# Patient Record
Sex: Female | Born: 1990 | Race: Black or African American | Hispanic: No | Marital: Single | Smoking: Never smoker
Health system: Southern US, Community
[De-identification: ages and names within clinical notes are randomized; demographics above are authoritative.]

## PROBLEM LIST (undated history)

## (undated) DIAGNOSIS — A749 Chlamydial infection, unspecified: Secondary | ICD-10-CM

## (undated) DIAGNOSIS — D649 Anemia, unspecified: Secondary | ICD-10-CM

## (undated) HISTORY — PX: NO PAST SURGERIES: SHX2092

---
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
## 2009-11-21 ENCOUNTER — Inpatient Hospital Stay (HOSPITAL_COMMUNITY): Admission: AD | Admit: 2009-11-21 | Discharge: 2009-11-21 | Payer: Self-pay | Admitting: Obstetrics & Gynecology

## 2010-05-20 ENCOUNTER — Emergency Department (HOSPITAL_COMMUNITY): Admission: EM | Admit: 2010-05-20 | Discharge: 2010-05-21 | Payer: Self-pay | Admitting: Emergency Medicine

## 2010-09-30 ENCOUNTER — Inpatient Hospital Stay (HOSPITAL_COMMUNITY)
Admission: AD | Admit: 2010-09-30 | Discharge: 2010-09-30 | Disposition: A | Discharge status: Home / Self Care | Payer: BC Managed Care – PPO | Source: Ambulatory Visit | Attending: Obstetrics & Gynecology | Admitting: Obstetrics & Gynecology

## 2010-09-30 DIAGNOSIS — J309 Allergic rhinitis, unspecified: Secondary | ICD-10-CM | POA: Insufficient documentation

## 2010-09-30 DIAGNOSIS — R05 Cough: Secondary | ICD-10-CM

## 2010-09-30 DIAGNOSIS — O9989 Other specified diseases and conditions complicating pregnancy, childbirth and the puerperium: Secondary | ICD-10-CM

## 2010-09-30 DIAGNOSIS — R059 Cough, unspecified: Secondary | ICD-10-CM | POA: Insufficient documentation

## 2010-09-30 DIAGNOSIS — O99891 Other specified diseases and conditions complicating pregnancy: Secondary | ICD-10-CM | POA: Insufficient documentation

## 2010-10-15 ENCOUNTER — Inpatient Hospital Stay (HOSPITAL_COMMUNITY)
Admission: AD | Admit: 2010-10-15 | Discharge: 2010-10-16 | Disposition: A | Discharge status: Home / Self Care | Payer: BC Managed Care – PPO | Source: Ambulatory Visit | Attending: Obstetrics & Gynecology | Admitting: Obstetrics & Gynecology

## 2010-10-15 DIAGNOSIS — O210 Mild hyperemesis gravidarum: Secondary | ICD-10-CM | POA: Insufficient documentation

## 2010-10-15 LAB — URINE MICROSCOPIC-ADD ON

## 2010-10-15 LAB — URINALYSIS, ROUTINE W REFLEX MICROSCOPIC
Hgb urine dipstick: NEGATIVE
Ketones, ur: 40 mg/dL — AB
Protein, ur: 30 mg/dL — AB
Urine Glucose, Fasting: NEGATIVE mg/dL
pH: 7 (ref 5.0–8.0)

## 2010-11-18 LAB — URINALYSIS, ROUTINE W REFLEX MICROSCOPIC
Bilirubin Urine: NEGATIVE
Ketones, ur: NEGATIVE mg/dL
Nitrite: NEGATIVE
Specific Gravity, Urine: 1.025 (ref 1.005–1.030)
Urobilinogen, UA: 0.2 mg/dL (ref 0.0–1.0)
pH: 6 (ref 5.0–8.0)

## 2010-11-18 LAB — URINE CULTURE

## 2010-11-18 LAB — URINE MICROSCOPIC-ADD ON

## 2013-08-25 NOTE — L&D Delivery Note (Signed)
Delivery Information for ZONIA CAPLIN     admitted at: 11/14/2013 10:50 PM, active labor and SROM    Patient Active Problem List   Diagnosis   . Pregnancy   . [redacted] weeks gestation of pregnancy       Time of Delivery: 12:31 AM  11/15/2013     Obstetrician:   Raoul Pitch, MD    Procedure: Spontaneous vaginal delivery                        A female infant was delivered from the [facility] presentation                       SROM of forebag with light meconium immediately prior to pushing                        Peds present for delivery; baby spontaneously cried                       Pushing began at 1225                       Anesthesia:  None     Infant Wt:      8 lb 5.7 oz (3790 g)    Infant Name:  Suede      Apgars:  8  at and 8  at 5 minutes    ROM Time:    ? 10 am 11-14-2013  Amniotic Fluid:   clear initially, light meconium with rupture of forebag      Placenta and Cord:          Mechanism: Spontaneous       Description: Intact                              Placenta delivered spontaneously and intact with a 3 vessel cord                           Uterus explored - empty    Episiotomy: None     Lacerations:   yes   Laceration Type: first degree right labial and 1st degree perineal   Episiotomy/Laceration Repair: No repair required     Estimated Blood Loss:   100 cc's        Specimens: none           Complications:  none

## 2013-11-14 ENCOUNTER — Inpatient Hospital Stay
Admission: EM | Admit: 2013-11-14 | Discharge: 2013-11-16 | DRG: 775 | Disposition: A | Discharge status: Home / Self Care | Payer: BC Managed Care – HMO | Source: Ambulatory Visit | Attending: Obstetrics & Gynecology | Admitting: Obstetrics & Gynecology

## 2013-11-14 ENCOUNTER — Ambulatory Visit
Admission: RE | Admit: 2013-11-14 | Discharge: 2013-11-14 | Discharge status: Against Medical Advice | Payer: BC Managed Care – HMO | Attending: Obstetrics & Gynecology | Admitting: Obstetrics & Gynecology

## 2013-11-14 ENCOUNTER — Inpatient Hospital Stay: Payer: BC Managed Care – HMO | Admitting: Obstetrics & Gynecology

## 2013-11-14 DIAGNOSIS — Z331 Pregnant state, incidental: Secondary | ICD-10-CM

## 2013-11-14 DIAGNOSIS — N898 Other specified noninflammatory disorders of vagina: Secondary | ICD-10-CM | POA: Insufficient documentation

## 2013-11-14 DIAGNOSIS — Z22330 Carrier of Group B streptococcus: Secondary | ICD-10-CM

## 2013-11-14 DIAGNOSIS — Z3A40 40 weeks gestation of pregnancy: Secondary | ICD-10-CM

## 2013-11-14 DIAGNOSIS — Z3490 Encounter for supervision of normal pregnancy, unspecified, unspecified trimester: Secondary | ICD-10-CM

## 2013-11-14 DIAGNOSIS — O99891 Other specified diseases and conditions complicating pregnancy: Secondary | ICD-10-CM | POA: Insufficient documentation

## 2013-11-14 DIAGNOSIS — O99892 Other specified diseases and conditions complicating childbirth: Principal | ICD-10-CM | POA: Diagnosis present

## 2013-11-14 HISTORY — DX: Chlamydial infection, unspecified: A74.9

## 2013-11-14 LAB — CBC AND DIFFERENTIAL
Basophils Absolute Automated: 0.03 10*3/uL (ref 0.00–0.20)
Basophils Automated: 0 %
Eosinophils Absolute Automated: 0.14 10*3/uL (ref 0.00–0.70)
Eosinophils Automated: 1 %
Hematocrit: 36.8 % — ABNORMAL LOW (ref 37.0–47.0)
Hgb: 12.1 g/dL (ref 12.0–16.0)
Immature Granulocytes Absolute: 0.03 10*3/uL
Immature Granulocytes: 0 %
Lymphocytes Absolute Automated: 1.53 10*3/uL (ref 0.50–4.40)
Lymphocytes Automated: 14 %
MCH: 26.9 pg — ABNORMAL LOW (ref 28.0–32.0)
MCHC: 32.9 g/dL (ref 32.0–36.0)
MCV: 82 fL (ref 80.0–100.0)
MPV: 12.5 fL — ABNORMAL HIGH (ref 9.4–12.3)
Monocytes Absolute Automated: 0.69 10*3/uL (ref 0.00–1.20)
Monocytes: 6 %
Neutrophils Absolute: 8.48 10*3/uL — ABNORMAL HIGH (ref 1.80–8.10)
Neutrophils: 78 %
Platelets: 227 10*3/uL (ref 140–400)
RBC: 4.49 10*6/uL (ref 4.20–5.40)
RDW: 14 % (ref 12–15)
WBC: 10.87 10*3/uL — ABNORMAL HIGH (ref 3.50–10.80)

## 2013-11-14 LAB — TYPE AND SCREEN
AB Screen Gel: NEGATIVE
ABO Rh: O POS

## 2013-11-14 LAB — RUPTURE OF MEMBRANE AMNISURE: Rupture of Membrane AmniSure: POSITIVE — AB

## 2013-11-14 MED ORDER — SODIUM CHLORIDE 0.9 % IV SOLN
2.5000 10*6.[IU] | INTRAVENOUS | Status: DC
Start: 2013-11-15 — End: 2013-11-15
  Filled 2013-11-14 (×5): qty 2.5

## 2013-11-14 MED ORDER — LACTATED RINGERS IV SOLN
INTRAVENOUS | Status: DC
Start: 2013-11-14 — End: 2013-11-15

## 2013-11-14 MED ORDER — FAMOTIDINE 10 MG/ML IV SOLN (WRAP)
20.0000 mg | Freq: Two times a day (BID) | INTRAVENOUS | Status: DC | PRN
Start: 2013-11-14 — End: 2013-11-15

## 2013-11-14 MED ORDER — ACETAMINOPHEN 325 MG PO TABS
650.0000 mg | ORAL_TABLET | ORAL | Status: DC | PRN
Start: 2013-11-14 — End: 2013-11-14

## 2013-11-14 MED ORDER — PENICILLIN 2.5 MU IN NORMAL SALINE 100 ML (CNR)
2.5000 10*6.[IU] | Status: DC
Start: 2013-11-15 — End: 2013-11-14

## 2013-11-14 MED ORDER — LACTATED RINGERS IV SOLN
INTRAVENOUS | Status: DC
Start: 2013-11-14 — End: 2013-11-14
  Administered 2013-11-14: 125 mL/h via INTRAVENOUS

## 2013-11-14 MED ORDER — TERBUTALINE SULFATE 1 MG/ML IJ SOLN
0.2500 mg | Freq: Once | INTRAMUSCULAR | Status: DC | PRN
Start: 2013-11-14 — End: 2013-11-15

## 2013-11-14 MED ORDER — ONDANSETRON HCL 4 MG/2ML IJ SOLN
8.0000 mg | Freq: Three times a day (TID) | INTRAMUSCULAR | Status: DC | PRN
Start: 2013-11-14 — End: 2013-11-15

## 2013-11-14 MED ORDER — PENICILLIN 5 MU IN NORMAL SALINE 100 ML (CNR)
5.0000 10*6.[IU] | Freq: Once | Status: AC
Start: 2013-11-14 — End: 2013-11-14
  Administered 2013-11-14: 5 10*6.[IU] via INTRAVENOUS
  Filled 2013-11-14: qty 100

## 2013-11-14 MED ORDER — OXYTOCIN 30 UNITS IN LR 500 ML LABOR -OUTSOURCED
2.0000 m[IU]/min | INTRAVENOUS | Status: DC
Start: 2013-11-14 — End: 2013-11-15
  Filled 2013-11-14: qty 500

## 2013-11-14 MED ORDER — PENICILLIN G POTASSIUM 5000000 UNITS IJ SOLR
2.5000 10*6.[IU] | INTRAMUSCULAR | Status: DC
Start: 2013-11-15 — End: 2013-11-14

## 2013-11-14 MED ORDER — ONDANSETRON HCL 4 MG/2ML IJ SOLN
8.0000 mg | Freq: Three times a day (TID) | INTRAMUSCULAR | Status: DC | PRN
Start: 2013-11-14 — End: 2013-11-14

## 2013-11-14 MED ORDER — CITRIC ACID-SODIUM CITRATE 334-500 MG/5ML PO SOLN
30.0000 mL | Freq: Once | ORAL | Status: DC | PRN
Start: 2013-11-14 — End: 2013-11-14

## 2013-11-14 MED ORDER — ACETAMINOPHEN 650 MG RE SUPP
650.0000 mg | RECTAL | Status: DC | PRN
Start: 2013-11-14 — End: 2013-11-15

## 2013-11-14 MED ORDER — SODIUM CHLORIDE 0.9 % IV MBP
5.0000 10*6.[IU] | Freq: Once | INTRAVENOUS | Status: DC
Start: 2013-11-14 — End: 2013-11-14

## 2013-11-14 NOTE — Progress Notes (Signed)
Pt here in triage for evaluation of c/o mucous plug and watery discharge since 0200 this am. Pt states she is a pt of the midwife practice out of [place]. Pt states she is GBS + and not able to deliver with them d/t not being able to pay the delivery fee. Pt talked with Pattie G. Today. Pt placed on monitor.

## 2013-11-14 NOTE — Progress Notes (Signed)
Telephone call from [place].  Pattie informed earlier that pt does not want to be cared for by Dr. Malen Gauze, the hospitalist today. Pattie discussed situation with Dr. Malen Gauze, Dr. Tristan Schroeder and Dr. Manson Passey and is en route to hospital to discuss situation with pt.
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
## 2013-11-14 NOTE — Progress Notes (Signed)
I came in to speak to [facility] as I had spoken to her on the phone a few times today and encouraged her to come in to be evaluated for Ruptured membranes. Erikah was with her mother and her nurse, Florentina Addison.  She expressed dissatisfaction with Dr. Malen Gauze and wanted [place] to get her another doctor.She was upset that he did not introduce himself and performed a vaginal exam and when she complained he said" Let me do my job" and he repeated it " Just let me do my job".   I apologized.  I had previously contacted. Frances Maywood, PCD, Dr. Tristan Schroeder, Chair and Dr. Manson Passey, [facility] medical director. I explained to the patient that Dr. Malen Gauze was who was covering in emergencies, clinic patients and walk ins. I offered to have the RN perform vaginal exams if necessary and have Dr. Malen Gauze come in for the delivery. She did not agree to this. The mother was more vocal about her perceptions of the way her daughter was treated by Dr. Malen Gauze and that "no body deserves to be treated that way.  We drove past 3 hospitals to come here because we thought she would get the best care." I reinforced to her that she is getting good care.  I asked her where she would go and she said to "[facility]". The patient's mother said that they" were not refusing care but they were refusing to be treated like dogs". She said this multiple times after they were told about the AMA form and signing out against medical advice and that insurance may not cover the care here. The mother was insistent that they were leaving and the patient agreed.

## 2013-11-14 NOTE — Progress Notes (Signed)
Pt has chosen to leave AMA.  Pt signs AMA form.  IV d/c'd.  Monitors removed.  Encouraged pt to go straight to closest medical facility to receive care.  Pt verbalized understanding.

## 2013-11-14 NOTE — H&P (Signed)
Labor and Delivery Admission Note    Subjective:   Jaclyn Wiggins is a 23 y.o. G2P0001 female at [redacted]w[redacted]d, Estimated Date of Delivery: 11/14/13.  She is being admitted for labor management.  Her current obstetrical history is significant for GBS colonizer .  Patient reports she lost her mucous plug about 2 am, began leaking fluid about 10 am, and contractions several hours after that..  She was initially seen at [facility] but did not like the on call doctor, so after a positive amnisure and a dose of penicillin for her GBS she signed herself out AMA.  Fetal Movement: normal.    PMH - none  PSH - none  Meds - PNV  All - NKDA  POB - G2P1, SVD at term 7 1/2 lbs;  Seen by midwife group until 2 weeks ago when she couldn't afford to pay the delivery fee so left the practice  PGYN - positive chlamydia in January, treated with antibiotics, negative TOC  SH - no tobacco    Objective:    Vital signs in last 24 hours:  Temp:  [98.6 F (37 C)] 98.6 F (37 C)  Heart Rate:  [85-92] 85   Resp Rate:  [20] 20   BP: (112-126)/(69-75) 125/72 mmHg     FHTS: 140s, + LTV  Toco:  Contractions q 2-3 minutes    General:   alert, appears stated age and cooperative   Lungs:   clear to auscultation bilaterally   Heart:   regular rate and rhythm, S1, S2 normal, no murmur, click, rub or gallop   Abdomen:  soft, non-tender; bowel sounds normal; no masses,  no organomegaly   SVE:  8/90/0/BBOW     Lab Review:  Lab Results   Component Value Date    ABORH O POS 11/14/2013        Assessment:   23 y.o. G29P0001 female at [redacted]w[redacted]d   Active labor with positive amnisure    Problem List:  Patient Active Problem List   Diagnosis   . Pregnancy   . [redacted] weeks gestation of pregnancy       Plan:   Admit to L&D   Anticipate SVD   Will continue penicillin for GBS   Patient has no prenatal records so will redraw prenatal profile for pediatrics     Risks, benefits, alternatives and possible complications have been discussed in detail with the patient.  All questions  answered and appropriate consents will be completed at the hospital.

## 2013-11-14 NOTE — Progress Notes (Signed)
Jaclyn Wiggins at bedside of pt to discuss care of pt and to be advised as to pt wishes for future care.

## 2013-11-15 LAB — HIV RAPID
Rapid HIV-1 p24 Antigen: NONREACTIVE
Rapid HIV-1/2  Antibody: NONREACTIVE

## 2013-11-15 LAB — RUBELLA AB IGG ICL (SOFT): Rubella AB, IgG: 107.5

## 2013-11-15 LAB — HEPATITIS B SURFACE ANTIGEN W/ REFLEX TO CONFIRMATION: Hepatitis B Surface Antigen: NONREACTIVE

## 2013-11-15 MED ORDER — OXYTOCIN 30UNITS IN LR 500 ML PP--OUTSOURCED
7.5000 [IU]/h | INTRAVENOUS | Status: DC
Start: 2013-11-15 — End: 2013-11-15
  Administered 2013-11-15: 999 [IU]/h via INTRAVENOUS
  Administered 2013-11-15: 15 [IU]/h via INTRAVENOUS
  Filled 2013-11-15: qty 500

## 2013-11-15 MED ORDER — ACETAMINOPHEN-CODEINE #3 300-30 MG PO TABS
1.0000 | ORAL_TABLET | ORAL | Status: DC | PRN
Start: 2013-11-15 — End: 2013-11-16

## 2013-11-15 MED ORDER — PRENATAL AD PO TABS
1.0000 | ORAL_TABLET | Freq: Every day | ORAL | Status: DC
Start: 2013-11-15 — End: 2013-11-16
  Administered 2013-11-15 – 2013-11-16 (×2): 1 via ORAL
  Filled 2013-11-15 (×2): qty 1

## 2013-11-15 MED ORDER — MAGNESIUM HYDROXIDE 400 MG/5ML PO SUSP
30.0000 mL | Freq: Four times a day (QID) | ORAL | Status: DC | PRN
Start: 2013-11-15 — End: 2013-11-16

## 2013-11-15 MED ORDER — METHYLERGONOVINE MALEATE 0.2 MG/ML IJ SOLN
200.0000 ug | Freq: Four times a day (QID) | INTRAMUSCULAR | Status: AC | PRN
Start: 2013-11-15 — End: 2013-11-16

## 2013-11-15 MED ORDER — OXYTOCIN 30UNITS IN LR 500 ML PP--OUTSOURCED
7.5000 [IU]/h | INTRAVENOUS | Status: AC
Start: 2013-11-15 — End: 2013-11-15

## 2013-11-15 MED ORDER — METHYLERGONOVINE MALEATE 0.2 MG PO TABS
0.2000 mg | ORAL_TABLET | Freq: Four times a day (QID) | ORAL | Status: AC | PRN
Start: 2013-11-15 — End: 2013-11-16

## 2013-11-15 MED ORDER — DOCUSATE SODIUM 100 MG PO CAPS
100.0000 mg | ORAL_CAPSULE | Freq: Two times a day (BID) | ORAL | Status: DC | PRN
Start: 2013-11-15 — End: 2013-11-16
  Administered 2013-11-15 – 2013-11-16 (×3): 100 mg via ORAL
  Filled 2013-11-15 (×3): qty 1

## 2013-11-15 MED ORDER — OXYCODONE-ACETAMINOPHEN 5-325 MG PO TABS
1.0000 | ORAL_TABLET | Freq: Once | ORAL | Status: AC | PRN
Start: 2013-11-15 — End: 2013-11-15
  Administered 2013-11-15: 1 via ORAL
  Filled 2013-11-15: qty 1

## 2013-11-15 MED ORDER — ONDANSETRON HCL 4 MG PO TABS
4.0000 mg | ORAL_TABLET | Freq: Three times a day (TID) | ORAL | Status: DC | PRN
Start: 2013-11-15 — End: 2013-11-16

## 2013-11-15 MED ORDER — IBUPROFEN 600 MG PO TABS
600.0000 mg | ORAL_TABLET | Freq: Four times a day (QID) | ORAL | Status: DC
Start: 2013-11-15 — End: 2013-11-16
  Administered 2013-11-15 (×4): 600 mg via ORAL
  Filled 2013-11-15 (×5): qty 1

## 2013-11-15 MED ORDER — MEASLES, MUMPS & RUBELLA VAC SC INJ
0.5000 mL | INJECTION | SUBCUTANEOUS | Status: DC | PRN
Start: 2013-11-15 — End: 2013-11-16

## 2013-11-15 MED ORDER — BENZOCAINE-MENTHOL 20-0.5 % EX AERO
1.0000 | INHALATION_SPRAY | CUTANEOUS | Status: DC | PRN
Start: 2013-11-15 — End: 2013-11-16
  Administered 2013-11-15: 1 via TOPICAL
  Filled 2013-11-15: qty 56

## 2013-11-15 MED ORDER — HYDROCORTISONE 1 % EX OINT
TOPICAL_OINTMENT | Freq: Three times a day (TID) | CUTANEOUS | Status: DC | PRN
Start: 2013-11-15 — End: 2013-11-16

## 2013-11-15 MED ORDER — LANOLIN EX OINT
TOPICAL_OINTMENT | CUTANEOUS | Status: DC | PRN
Start: 2013-11-15 — End: 2013-11-16

## 2013-11-15 MED ORDER — WITCH HAZEL-GLYCERIN EX PADS
1.0000 | MEDICATED_PAD | CUTANEOUS | Status: DC | PRN
Start: 2013-11-15 — End: 2013-11-16
  Administered 2013-11-15: 1 via TOPICAL
  Filled 2013-11-15: qty 40

## 2013-11-15 MED ORDER — IBUPROFEN 600 MG PO TABS
600.0000 mg | ORAL_TABLET | Freq: Once | ORAL | Status: DC | PRN
Start: 2013-11-15 — End: 2013-11-15

## 2013-11-15 NOTE — Progress Notes (Signed)
Mom and baby transferred from L&D to FCC room    332. ID bands on baby's ankles, match mom's.  Verified with L&D RN.  Correct pediatrician verified with mom.

## 2013-11-15 NOTE — Progress Notes (Signed)
MOM     23 y.o.  [id number]  [redacted]w[redacted]d weeks  No Known Allergies  Her current pregnancy is significant for   Patient Active Problem List   Diagnosis   . Pregnancy   . [redacted] weeks gestation of pregnancy     Prior Delivery: spontaneous vaginal    Past Medical History   Diagnosis Date   . Chlamydia      treated Jan. 2015       History reviewed. No pertinent past surgical history.    Pertinent prenatal Labs -   Lab Results   Component Value Date    ABORH O POS 11/14/2013         Delivering Clinician:     Wardell Heath T   Delivery Type:     Vaginal, Spontaneous Delivery   Delivery Date and Time: 11/15/2013  12:31 AM     Delivery Complications: none    Rupture Date - 11/14/2013   Rupture time - 10:00 AM   Fluid color - Meconium     Laceration (Yes/None):     Yes   Laceration Type:     1st;Labial   Episiotomy:     None      Anesthesia:     None       ADDITIONAL COMMENTS:    Prenatal Scanned into Epic: No      Medications given in L&D- Percocet 0109      Recent Output-  375    Fundus- Firm at 0045, Firm after massage at 0240    Lochia- rubra small with few clots      EBL: 100 ,0     Pain Score: 5                    BABY    Security [id number]     Baby Safety Contract Signed Yes       Vaginal, Spontaneous Delivery  of live female  infant 8 lb 5.7 oz (3790 g)      1 Minute Apgar:     8      5 Minute Apgar:     8     Nuchal Cord:       ADDITIONAL COMMENTS:  Feeding:     Skin-to-skin initiated Yes               Labs due- 10 hr cbc  Cord blood- Lab/Blood Bank   Eyes & Thighs: done    Void - No   Stool - Yes   Circumcision Undecided        At home Pediatrician: [facility] Pediatrician: komis      Bedside report given to RN using ISHAPED report.  Safety checks done. Pt care relinquished.    Elliott Lasecki N Shriya Aker  11/15/2013 2:09 AM

## 2013-11-15 NOTE — Progress Notes (Signed)
RN attempts to check fundus again, patient wants "a few more minutes".

## 2013-11-15 NOTE — Progress Notes (Signed)
Pt given Percocet for pain relief.

## 2013-11-15 NOTE — Progress Notes (Signed)
Delivery Date/Time: 11/15/2013 12:31 AM   Delivery Type:  Vaginal, Spontaneous Delivery            Laceration Type:     1st;Labial   Episiotomy:     None      GP: G2P2002  EBL: 100   OB: PICKFORD, LAURA T   No Known Allergies    PRENATAL  Lab Results   Component Value Date    ABORH O POS 11/14/2013       Past Medical History   Diagnosis Date   . Chlamydia      treated Jan. 2015     History reviewed. No pertinent past surgical history.    Vaccine Screening:        Tdap:       Wants    Given    Declined       Flu:        Wants    Given    Declined    Pneumo:  Wants    Given    Declined    MMR:    Needs    Given    Declined  Rhogam:   Needs   Given           Sch Meds:________________________      Stool Softeners_________________           PRN Meds: ____________________________     IV:  []    Foley:  []     On Q Pump:  []     Breast Feeding Class: ?[]    Discharge Class:  []      My Chart Completed   []    Med Cards Given:   []     Safety Contract:  []   ________________________________________________________________________    Hospital Peds:    Home Peds:    H&P:  []         Baby Gender: Female   Gestation:  [redacted]w[redacted]d  APGAR: 8  and 8    Birth Weight: 8 lb 5.7 oz (3790 g)   Feeding Type:        Void:  []        Stool:  []        Bath:  []        Hep B:  []      Eye & Thigh:  []        Blood Type: __________     Labs:___________________________________________________       Hear:     []    CID:  []                  TCB:  ______ @_____  hr   CCHD:  []        PKU:    []        Circ:      []     CSC:    []        My Chart Completed   [] 

## 2013-11-15 NOTE — Progress Notes (Signed)
15 mins since last fundal check; advised patient we need to do another check to make sure she isn't bleeding excessively but she requests "a break". Patient's mom present and although risks of were discussed, i.e. Bleeding/PPH, both of them want her to take a break.

## 2013-11-15 NOTE — Progress Notes (Signed)
S - complaints: none.  Ambulating and tolerating regular diet. Breast feeding well.  Bleeding decreasing  O - VSS, afebrile   Breasts - soft   Abd - fundus firm and non-tender    A/P - PP day 0, s/p SVD    Stable              Routine PP care

## 2013-11-15 NOTE — Plan of Care (Signed)
Problem: Breast Care  Goal: Breasts are soft with nipple integrity intact-vaginal delivery-recovery and post partum.  Pt's breasts are soft with no damage to the nipple. Pt declines breast feeding class and consult. Pt encouraged to call if assistance is needed. Pt agrees.

## 2013-11-15 NOTE — Progress Notes (Signed)
Denies any symptoms suggestive of post dural puncture headache.    Ambulating without difficulty.      No apparent complications from regional anesthesia.

## 2013-11-16 LAB — RPR: RPR: NONREACTIVE

## 2013-11-16 NOTE — Discharge Instructions (Signed)
Call the office to schedule your follow up appointment.  Information on how to take care of yourself after baby can be found in the New Mother and infant care booklet.  Please refer to page 4 for a list of reasons to call your doctor.  If you have any additional questions or concerns please contact your OB.

## 2013-11-16 NOTE — Progress Notes (Signed)
Discharge order entered.  Vaccine screening complete and vaccines not given because pt refused.    [place] instructions given.  Questions answered.  Mom Hastings'd

## 2013-11-16 NOTE — Discharge Summary (Signed)
Dischage Summary for Jaclyn Wiggins,  Estimated Date of Delivery: 11/14/13      Patient Active Problem List   Diagnosis   . Pregnancy   . [redacted] weeks gestation of pregnancy         Lab Results   Component Value Date    ABORH O POS 11/14/2013    HEPBSAG Non-Reactive 11/14/2013    RUBELLAABIGG 107.5 11/14/2013          Labor complications: pt seen at [place], did not like doc on call so left AMA and came to [facility]. Had care at [facility] clinic - treated for chlamydia and told was a GBS carrier.   Spontaneous vaginal delivery,first degree laceration   Postpartum complications: none  Rhogam/Vaccines: none needed. Hep B negative, HIV negative.    Pt stable for d/c - follow up with clinic in 6 weeks.  Phil Dopp, MD

## 2013-11-16 NOTE — Plan of Care (Signed)
Problem: Pain/Discomfort  Goal: Pain/discomfort is manageable.  Outcome: Progressing  Pain is well controlled with Ibuprofen every 6 hours as needed

## 2013-11-18 LAB — HEMOLYSIS INDEX: Hemolysis Index: 111 — ABNORMAL HIGH (ref 0–18)

## 2016-09-18 ENCOUNTER — Ambulatory Visit (INDEPENDENT_AMBULATORY_CARE_PROVIDER_SITE_OTHER): Payer: Self-pay | Admitting: Clinical Neuropsychologist

## 2017-03-30 ENCOUNTER — Emergency Department (HOSPITAL_COMMUNITY)
Admission: EM | Admit: 2017-03-30 | Discharge: 2017-03-30 | Disposition: A | Discharge status: Home / Self Care | Payer: Managed Care, Other (non HMO) | Attending: Emergency Medicine | Admitting: Emergency Medicine

## 2017-03-30 ENCOUNTER — Encounter (HOSPITAL_COMMUNITY): Payer: Self-pay

## 2017-03-30 DIAGNOSIS — J029 Acute pharyngitis, unspecified: Secondary | ICD-10-CM

## 2017-03-30 DIAGNOSIS — H9201 Otalgia, right ear: Secondary | ICD-10-CM | POA: Insufficient documentation

## 2017-03-30 DIAGNOSIS — R070 Pain in throat: Secondary | ICD-10-CM | POA: Diagnosis present

## 2017-03-30 LAB — RAPID STREP SCREEN (MED CTR MEBANE ONLY): STREPTOCOCCUS, GROUP A SCREEN (DIRECT): NEGATIVE

## 2017-03-30 MED ORDER — AMOXICILLIN 500 MG PO CAPS: 500.0000 mg | ORAL_CAPSULE | Freq: Two times a day (BID) | ORAL | 0 refills | Status: AC

## 2017-03-30 MED ORDER — KETOROLAC TROMETHAMINE 60 MG/2ML IM SOLN
60.0000 mg | Freq: Once | INTRAMUSCULAR | Status: AC
  Administered 2017-03-30: 60 mg via INTRAMUSCULAR
  Filled 2017-03-30: qty 2

## 2017-03-30 MED ORDER — IBUPROFEN 600 MG PO TABS: 600.0000 mg | ORAL_TABLET | Freq: Four times a day (QID) | ORAL | 0 refills | Status: DC | PRN

## 2017-03-30 NOTE — ED Triage Notes (Addendum)
Pt states she went swimming yesterday morning and woke up today with right ear pain, sore throat, chills, and weakness. No known fever at home.  Pt states she thinks she has pneumonia. Denies cough or sob other than nasal congestion.

## 2017-03-30 NOTE — ED Provider Notes (Signed)
[facility]-EMERGENCY DEPT Provider Note   CSN: [id number] Arrival date & time: 03/30/17  1525     History   Chief Complaint Chief Complaint  Patient presents with  . Weakness  . Otalgia  . Nasal Congestion    HPI Sara Estes is a 26 y.o. female.  HPI   Sara Estes is a 26 y.o. female, patient with no pertinent past medical history, presenting to the ED with right ear pain, right-sided sore throat, nasal congestion, and chills beginning last night. States she went swimming yesterday morning and thinks this may have a connection to her symptoms. Pain is 6/10, aching, nonradiating. Has taken one dose of tylenol without relief. Denies cough, difficulty breathing/swallowing, rash, ear drainage, or any other complaints.      History reviewed. No pertinent past medical history.  There are no active problems to display for this patient.   History reviewed. No pertinent surgical history.  OB History    No data available       Home Medications    Prior to Admission medications   Medication Sig Start Date End Date Taking? Authorizing Provider  amoxicillin (AMOXIL) 500 MG capsule Take 1 capsule (500 mg total) by mouth 2 (two) times daily. 03/30/17 04/09/17  Devansh Riese C, PA-C  ibuprofen (ADVIL,MOTRIN) 600 MG tablet Take 1 tablet (600 mg total) by mouth every 6 (six) hours as needed. 03/30/17   Dianelly Ferran, Hillard Danker, PA-C    Family History No family history on file.  Social History Social History  Substance Use Topics  . Smoking status: Never Smoker  . Smokeless tobacco: Never Used  . Alcohol use No     Allergies   Patient has no known allergies.   Review of Systems Review of Systems  Constitutional: Positive for chills. Negative for fever.  HENT: Positive for congestion, ear pain (right) and sore throat. Negative for trouble swallowing and voice change.   Respiratory: Negative for shortness of breath.   Gastrointestinal: Negative for nausea and vomiting.  All other systems  reviewed and are negative.    Physical Exam Updated Vital Signs BP 108/71 (BP Location: Left Arm)   Pulse (!) 115   Temp 99.3 F (37.4 C) (Oral)   Resp 18   Ht 5\' 8"  (1.727 m)   Wt 68 kg (150 lb)   LMP 02/27/2017   SpO2 99%   BMI 22.81 kg/m   Physical Exam  Constitutional: She appears well-developed and well-nourished. No distress.  HENT:  Head: Normocephalic and atraumatic.  Right Ear: Tympanic membrane, external ear and ear canal normal.  Left Ear: Tympanic membrane, external ear and ear canal normal.  Mouth/Throat: Uvula is midline and mucous membranes are normal. Oropharyngeal exudate, posterior oropharyngeal edema and posterior oropharyngeal erythema present.  Erythema, minor edema, and some exudate on the right side of the posterior pharynx.  Eyes: Conjunctivae are normal.  Neck: Normal range of motion. Neck supple.  Cardiovascular: Normal rate, regular rhythm, normal heart sounds and intact distal pulses.   Pulmonary/Chest: Effort normal and breath sounds normal. No respiratory distress.  Abdominal: Soft. There is no tenderness. There is no guarding.  Musculoskeletal: She exhibits no edema.  Lymphadenopathy:    She has no cervical adenopathy.  Neurological: She is alert.  Skin: Skin is warm and dry. She is not diaphoretic.  Psychiatric: She has a normal mood and affect. Her behavior is normal.  Nursing note and vitals reviewed.    ED Treatments / Results  Labs (all labs ordered are  listed, but only abnormal results are displayed) Labs Reviewed  RAPID STREP SCREEN (NOT AT [facility])  CULTURE, GROUP A STREP [facility])    EKG  EKG Interpretation None       Radiology No results found.  Procedures Procedures (including critical care time)  Medications Ordered in ED Medications  ketorolac (TORADOL) injection 60 mg (60 mg Intramuscular Given 03/30/17 1944)     Initial Impression / Assessment and Plan / ED Course  I have reviewed the triage vital signs and the  nursing notes.  Pertinent labs & imaging results that were available during my care of the patient were reviewed by me and considered in my medical decision making (see chart for details).     Patient presents with sore throat, ear pain, and body aches. Unilateral symptoms warrant early antibiotic therapy, despite negative rapid strep. Shared decision making for IM penicillin versus PO amoxicillin. Patient opted for amoxicillin. The patient was given instructions for home care as well as return precautions. Patient voices understanding of these instructions, accepts the plan, and is comfortable with discharge.  Vitals:   03/30/17 1647 03/30/17 1648 03/30/17 1902 03/30/17 1959  BP: 108/71  110/72 104/66  Pulse: (!) 115  (!) 101 95  Resp: 18  18 18   Temp: 99.3 F (37.4 C)  98.9 F (37.2 C)   TempSrc: Oral  Oral   SpO2: 99%  100% 98%  Weight:  68 kg (150 lb)    Height:  5\' 8"  (1.727 m)        Final Clinical Impressions(s) / ED Diagnoses   Final diagnoses:  Pharyngitis, unspecified etiology    New Prescriptions Discharge Medication List as of 03/30/2017  7:56 PM    START taking these medications   Details  amoxicillin (AMOXIL) 500 MG capsule Take 1 capsule (500 mg total) by mouth 2 (two) times daily., Starting Mon 03/30/2017, Until Thu 04/09/2017, Print    ibuprofen (ADVIL,MOTRIN) 600 MG tablet Take 1 tablet (600 mg total) by mouth every 6 (six) hours as needed., Starting Mon 03/30/2017, Print         Jamee Keach, FriedenswaldShawn C, PA-C 03/31/17 0043    Tilden Fossaees, Elizabeth, MD 03/31/17 [id number]

## 2017-03-30 NOTE — Discharge Instructions (Signed)
Your symptoms may be consistent with a viral illness. Viruses do not require antibiotics, however, since this may also be a developing bacterial infection, an antibiotic has been prescribed. Other treatment is symptomatic care and it is important to note that if your symptoms are due to a virus, they may last for 7-14 days.   Hand washing: Wash your hands throughout the day, but especially before and after touching the face, using the restroom, sneezing, coughing, or touching surfaces that have been coughed or sneezed upon. Hydration: Symptoms will be intensified and complicated by dehydration. Dehydration can also extend the duration of symptoms. Drink plenty of fluids and get plenty of rest. You should be drinking at least half a liter of water an hour to stay hydrated. Electrolyte drinks are also encouraged. You should be drinking enough fluids to make your urine light yellow, almost clear. If this is not the case, you are not drinking enough water. Please note that some of the treatments indicated below will not be effective if you are not adequately hydrated. Pain or fever: Ibuprofen, Naproxen, or Tylenol for pain or fever.  Congestion: Plain Mucinex may help relieve congestion. Saline sinus rinses and saline nasal sprays may also help relieve congestion. If you do not have heart problems or an allergy to such medications, you may also try phenylephrine or Sudafed. Sore throat: Warm liquids or Chloraseptic spray may help soothe a sore throat. Gargle twice a day with a salt water solution made from a half teaspoon of salt in a cup of warm water.  Follow up: Follow up with a primary care provider, as needed, for any future management of this issue.

## 2017-04-02 LAB — CULTURE, GROUP A STREP (THRC)

## 2017-08-10 LAB — OB RESULTS CONSOLE RPR: RPR: NONREACTIVE

## 2017-08-10 LAB — OB RESULTS CONSOLE GC/CHLAMYDIA
Chlamydia: NEGATIVE
Gonorrhea: NEGATIVE

## 2017-08-10 LAB — OB RESULTS CONSOLE HEPATITIS B SURFACE ANTIGEN: HEP B S AG: NEGATIVE

## 2017-08-10 LAB — OB RESULTS CONSOLE HIV ANTIBODY (ROUTINE TESTING): HIV: NONREACTIVE

## 2017-08-10 LAB — OB RESULTS CONSOLE RUBELLA ANTIBODY, IGM: Rubella: IMMUNE

## 2017-08-21 ENCOUNTER — Encounter: Payer: Medicaid Other | Admitting: Obstetrics and Gynecology

## 2017-08-25 NOTE — L&D Delivery Note (Signed)
Delivery Note At  a viable female was delivered via  (Presentation: ; LOA ).  APGAR :9 ,9 ; weight pending  .   Placenta status:complete , . 3V Cord:  with the following complications:None .    Anesthesia:  Epideral Episiotomy:  None Lacerations:  None Suture Repair: N/A Est. Blood Loss (mL):  75cc  Mom to postpartum.  Baby to Couplet care / Skin to Skin.  Sara Estes 03/03/2018, 11:27 PM

## 2018-03-03 ENCOUNTER — Inpatient Hospital Stay (HOSPITAL_COMMUNITY)
Admission: AD | Admit: 2018-03-03 | Discharge: 2018-03-05 | DRG: 807 | Disposition: A | Discharge status: Home / Self Care | Payer: Medicaid Other | Source: Ambulatory Visit | Attending: Obstetrics and Gynecology | Admitting: Obstetrics and Gynecology

## 2018-03-03 ENCOUNTER — Inpatient Hospital Stay (HOSPITAL_COMMUNITY): Payer: Medicaid Other | Admitting: Anesthesiology

## 2018-03-03 ENCOUNTER — Encounter (HOSPITAL_COMMUNITY): Payer: Self-pay | Admitting: *Deleted

## 2018-03-03 DIAGNOSIS — O4290 Premature rupture of membranes, unspecified as to length of time between rupture and onset of labor, unspecified weeks of gestation: Secondary | ICD-10-CM

## 2018-03-03 DIAGNOSIS — Z3A40 40 weeks gestation of pregnancy: Secondary | ICD-10-CM

## 2018-03-03 DIAGNOSIS — O4292 Full-term premature rupture of membranes, unspecified as to length of time between rupture and onset of labor: Principal | ICD-10-CM | POA: Diagnosis present

## 2018-03-03 HISTORY — DX: Chlamydial infection, unspecified: A74.9

## 2018-03-03 LAB — CBC
HEMATOCRIT: 35.1 % — AB (ref 36.0–46.0)
Hemoglobin: 11.4 g/dL — ABNORMAL LOW (ref 12.0–15.0)
MCH: 26.5 pg (ref 26.0–34.0)
MCHC: 32.5 g/dL (ref 30.0–36.0)
MCV: 81.4 fL (ref 78.0–100.0)
Platelets: 207 10*3/uL (ref 150–400)
RBC: 4.31 MIL/uL (ref 3.87–5.11)
RDW: 14.2 % (ref 11.5–15.5)
WBC: 10.3 10*3/uL (ref 4.0–10.5)

## 2018-03-03 LAB — TYPE AND SCREEN
ABO/RH(D): O POS
ANTIBODY SCREEN: NEGATIVE

## 2018-03-03 LAB — AMNISURE RUPTURE OF MEMBRANE (ROM) NOT AT ARMC: Amnisure ROM: POSITIVE

## 2018-03-03 LAB — ABO/RH: ABO/RH(D): O POS

## 2018-03-03 MED ORDER — TERBUTALINE SULFATE 1 MG/ML IJ SOLN
0.2500 mg | Freq: Once | INTRAMUSCULAR | Status: DC | PRN
  Filled 2018-03-03: qty 1

## 2018-03-03 MED ORDER — LIDOCAINE HCL (PF) 1 % IJ SOLN
INTRAMUSCULAR | Status: DC | PRN
  Administered 2018-03-03: 13 mL via EPIDURAL

## 2018-03-03 MED ORDER — PHENYLEPHRINE 40 MCG/ML (10ML) SYRINGE FOR IV PUSH (FOR BLOOD PRESSURE SUPPORT)
80.0000 ug | PREFILLED_SYRINGE | INTRAVENOUS | Status: DC | PRN
  Filled 2018-03-03: qty 5

## 2018-03-03 MED ORDER — OXYTOCIN BOLUS FROM INFUSION
500.0000 mL | Freq: Once | INTRAVENOUS | Status: AC
  Administered 2018-03-03: 500 mL via INTRAVENOUS

## 2018-03-03 MED ORDER — OXYTOCIN 40 UNITS IN LACTATED RINGERS INFUSION - SIMPLE MED
2.5000 [IU]/h | INTRAVENOUS | Status: DC
  Filled 2018-03-03: qty 1000

## 2018-03-03 MED ORDER — FENTANYL 2.5 MCG/ML BUPIVACAINE 1/10 % EPIDURAL INFUSION (WH - ANES)
INTRAMUSCULAR | Status: AC
  Filled 2018-03-03: qty 100

## 2018-03-03 MED ORDER — ONDANSETRON HCL 4 MG/2ML IJ SOLN: 4.0000 mg | Freq: Four times a day (QID) | INTRAMUSCULAR | Status: DC | PRN

## 2018-03-03 MED ORDER — LACTATED RINGERS IV SOLN
INTRAVENOUS | Status: DC
  Administered 2018-03-03: 15:00:00 via INTRAVENOUS

## 2018-03-03 MED ORDER — DIPHENHYDRAMINE HCL 50 MG/ML IJ SOLN: 12.5000 mg | INTRAMUSCULAR | Status: DC | PRN

## 2018-03-03 MED ORDER — FENTANYL CITRATE (PF) 100 MCG/2ML IJ SOLN
50.0000 ug | INTRAMUSCULAR | Status: DC | PRN
  Administered 2018-03-03: 100 ug via INTRAVENOUS
  Filled 2018-03-03: qty 2

## 2018-03-03 MED ORDER — OXYTOCIN 40 UNITS IN LACTATED RINGERS INFUSION - SIMPLE MED
1.0000 m[IU]/min | INTRAVENOUS | Status: DC
  Administered 2018-03-03: 2 m[IU]/min via INTRAVENOUS
  Filled 2018-03-03: qty 1000

## 2018-03-03 MED ORDER — EPHEDRINE 5 MG/ML INJ
10.0000 mg | INTRAVENOUS | Status: DC | PRN
  Filled 2018-03-03: qty 2

## 2018-03-03 MED ORDER — OXYTOCIN 40 UNITS IN LACTATED RINGERS INFUSION - SIMPLE MED: 1.0000 m[IU]/min | INTRAVENOUS | Status: DC

## 2018-03-03 MED ORDER — PENICILLIN G POT IN DEXTROSE 60000 UNIT/ML IV SOLN
3.0000 10*6.[IU] | INTRAVENOUS | Status: DC
  Administered 2018-03-03: 3 10*6.[IU] via INTRAVENOUS
  Filled 2018-03-03 (×5): qty 50

## 2018-03-03 MED ORDER — ACETAMINOPHEN 325 MG PO TABS: 650.0000 mg | ORAL_TABLET | ORAL | Status: DC | PRN

## 2018-03-03 MED ORDER — LACTATED RINGERS IV SOLN: 500.0000 mL | Freq: Once | INTRAVENOUS | Status: DC

## 2018-03-03 MED ORDER — SOD CITRATE-CITRIC ACID 500-334 MG/5ML PO SOLN: 30.0000 mL | ORAL | Status: DC | PRN

## 2018-03-03 MED ORDER — SODIUM CHLORIDE 0.9 % IV SOLN
5.0000 10*6.[IU] | Freq: Once | INTRAVENOUS | Status: AC
  Administered 2018-03-03: 5 10*6.[IU] via INTRAVENOUS
  Filled 2018-03-03: qty 5

## 2018-03-03 MED ORDER — PHENYLEPHRINE 40 MCG/ML (10ML) SYRINGE FOR IV PUSH (FOR BLOOD PRESSURE SUPPORT)
PREFILLED_SYRINGE | INTRAVENOUS | Status: AC
  Filled 2018-03-03: qty 10

## 2018-03-03 MED ORDER — LACTATED RINGERS IV SOLN: 500.0000 mL | INTRAVENOUS | Status: DC | PRN

## 2018-03-03 MED ORDER — OXYCODONE-ACETAMINOPHEN 5-325 MG PO TABS: 1.0000 | ORAL_TABLET | ORAL | Status: DC | PRN

## 2018-03-03 MED ORDER — OXYCODONE-ACETAMINOPHEN 5-325 MG PO TABS: 2.0000 | ORAL_TABLET | ORAL | Status: DC | PRN

## 2018-03-03 MED ORDER — LIDOCAINE HCL (PF) 1 % IJ SOLN
30.0000 mL | INTRAMUSCULAR | Status: DC | PRN
  Filled 2018-03-03: qty 30

## 2018-03-03 MED ORDER — FENTANYL 2.5 MCG/ML BUPIVACAINE 1/10 % EPIDURAL INFUSION (WH - ANES)
14.0000 mL/h | INTRAMUSCULAR | Status: DC | PRN
  Administered 2018-03-03: 14 mL/h via EPIDURAL

## 2018-03-03 NOTE — H&P (Signed)
Sara Estes is a 27 y.o. female presenting for PROM.   OB History    Gravida  3   Para  2   Term  2   Preterm      AB      Living  2     SAB      TAB      Ectopic      Multiple      Live Births  2          Past Medical History:  Diagnosis Date  . Chlamydia    Past Surgical History:  Procedure Laterality Date  . NO PAST SURGERIES     Family History: family history is not on file. Social History:  reports that she has never smoked. She has never used smokeless tobacco. She reports that she does not drink alcohol or use drugs.     Maternal Diabetes: No Genetic Screening: Declined Maternal Ultrasounds/Referrals: Normal Fetal Ultrasounds or other Referrals:  None Maternal Substance Abuse:  No Significant Maternal Medications:  None Significant Maternal Lab Results:  None Other Comments:   Patient reports she ruptured at 2300 last night. Initially she was having regular, painful contractions but they stopped a few hours after she ruptured. Patient had no induction/augmentation with first two babies and desires to wait a few hours and see if she will begin to labor on her own. Patient and MD both agreed on 1830 as the cut-off for when we would begin pitocin if she is not laboring, as long as patient and baby stable.   Review of Systems  All other systems reviewed and are negative.  Maternal Medical History:  Reason for admission: Rupture of membranes.   Contractions: Onset was 13-24 hours ago.    Fetal activity: Perceived fetal activity is normal.   Last perceived fetal movement was within the past hour.    Prenatal Complications - Diabetes: none.    Dilation: 3.5 Effacement (%): 50 Station: -2 Exam by:: E. Waynette ButteryGreer CNM  Vitals:   03/03/18 1218  BP: 120/73  Pulse: 86  Resp: 18  Temp: 98.1 F (36.7 C)  TempSrc: Oral  SpO2: 100%  Weight: 74.8 kg (165 lb)  Height: 5\' 8"  (1.727 m)   Results for orders placed or performed during the hospital  encounter of 03/03/18 (from the past 24 hour(s))  [facility]     Status: None   Collection Time: 03/03/18  1:14 PM  Result Value Ref Range   Amnisure ROM POSITIVE      Maternal Exam:  Abdomen: Patient reports no abdominal tenderness. Fundal height is Size = dates.   Estimated fetal weight is 8lbs .   Fetal presentation: vertex  Introitus: Normal vulva. Normal vagina.  Amniotic fluid character: clear.  Pelvis: adequate for delivery.   Cervix: Cervix evaluated by digital exam.     Fetal Exam Fetal Monitor Review: Mode: ultrasound.   Baseline rate: 140.  Variability: moderate (6-25 bpm).   Pattern: accelerations present and no decelerations.    Fetal State Assessment: Category I - tracings are normal.     Physical Exam  Nursing note and vitals reviewed. Constitutional: She is oriented to person, place, and time. She appears well-developed and well-nourished.  HENT:  Head: Normocephalic.  Eyes: Pupils are equal, round, and reactive to light.  Cardiovascular: Normal rate, regular rhythm and normal heart sounds.  Respiratory: Effort normal and breath sounds normal.  Genitourinary: Vagina normal and uterus normal.  Musculoskeletal: Normal range of motion.  Neurological: She is alert and oriented to person, place, and time.  Skin: Skin is warm and dry.  Psychiatric: She has a normal mood and affect. Her behavior is normal. Judgment and thought content normal.    Prenatal labs: ABO, Rh:  O+ Antibody:  Negative Rubella:  Immune RPR:   NR HBsAg:   NR HIV:   NR GBS:   Positive  Assessment/Plan: 27 y.o. G3P2 at [redacted]w[redacted]d PROM Category 1 FHTs Patient desires unmedicated birth  Consulted Dr. Normand Sloop regarding plan of care Admit to L&D Allow patient to walk and try nipple stimulation per hospital protocol until 1830 If not laboring by 1830, will begin pitocin  Penicillin for GBS prophylaxis   Sara Estes 03/03/2018, 3:26 PM

## 2018-03-03 NOTE — MAU Note (Signed)
Pt reports ? ROM last pm at 2300, ? Contractions.

## 2018-03-03 NOTE — Progress Notes (Signed)
Patient given breast pump to stimulation contractions. Fetal heart rate is cat 1. Instructed patient to pump for 5 minutes and rest for a minimum of 15 minutes.

## 2018-03-03 NOTE — Anesthesia Procedure Notes (Signed)
Epidural Patient location during procedure: OB Start time: 03/03/2018 9:48 PM End time: 03/03/2018 10:07 PM  Staffing Anesthesiologist: Lowella CurbMiller, Syleena Mchan Ray, MD Performed: anesthesiologist   Preanesthetic Checklist Completed: patient identified, site marked, surgical consent, pre-op evaluation, timeout performed, IV checked, risks and benefits discussed and monitors and equipment checked  Epidural Patient position: sitting Prep: ChloraPrep Patient monitoring: heart rate, cardiac monitor, continuous pulse ox and blood pressure Approach: midline Location: L2-L3 Injection technique: LOR saline  Needle:  Needle type: Tuohy  Needle gauge: 17 G Needle length: 9 cm Needle insertion depth: 5 cm Catheter type: closed end flexible Catheter size: 20 Guage Catheter at skin depth: 9 cm Test dose: negative  Assessment Events: blood not aspirated, injection not painful, no injection resistance, negative IV test and no paresthesia  Additional Notes Reason for block:procedure for pain

## 2018-03-03 NOTE — Anesthesia Preprocedure Evaluation (Signed)

## 2018-03-04 ENCOUNTER — Encounter (HOSPITAL_COMMUNITY): Payer: Self-pay

## 2018-03-04 ENCOUNTER — Other Ambulatory Visit: Payer: Self-pay

## 2018-03-04 LAB — CBC
HEMATOCRIT: 33 % — AB (ref 36.0–46.0)
Hemoglobin: 10.9 g/dL — ABNORMAL LOW (ref 12.0–15.0)
MCH: 26.7 pg (ref 26.0–34.0)
MCHC: 33 g/dL (ref 30.0–36.0)
MCV: 80.7 fL (ref 78.0–100.0)
PLATELETS: 238 10*3/uL (ref 150–400)
RBC: 4.09 MIL/uL (ref 3.87–5.11)
RDW: 14.3 % (ref 11.5–15.5)
WBC: 15.6 10*3/uL — ABNORMAL HIGH (ref 4.0–10.5)

## 2018-03-04 LAB — RPR: RPR Ser Ql: NONREACTIVE

## 2018-03-04 MED ORDER — SIMETHICONE 80 MG PO CHEW: 80.0000 mg | CHEWABLE_TABLET | ORAL | Status: DC | PRN

## 2018-03-04 MED ORDER — BENZOCAINE-MENTHOL 20-0.5 % EX AERO
1.0000 "application " | INHALATION_SPRAY | CUTANEOUS | Status: DC | PRN
  Administered 2018-03-04: 1 via TOPICAL
  Filled 2018-03-04: qty 56

## 2018-03-04 MED ORDER — TETANUS-DIPHTH-ACELL PERTUSSIS 5-2.5-18.5 LF-MCG/0.5 IM SUSP
0.5000 mL | Freq: Once | INTRAMUSCULAR | Status: AC
  Administered 2018-03-04: 0.5 mL via INTRAMUSCULAR
  Filled 2018-03-04: qty 0.5

## 2018-03-04 MED ORDER — IBUPROFEN 600 MG PO TABS
600.0000 mg | ORAL_TABLET | Freq: Four times a day (QID) | ORAL | Status: DC
  Administered 2018-03-04 – 2018-03-05 (×6): 600 mg via ORAL
  Filled 2018-03-04 (×7): qty 1

## 2018-03-04 MED ORDER — ONDANSETRON HCL 4 MG PO TABS: 4.0000 mg | ORAL_TABLET | ORAL | Status: DC | PRN

## 2018-03-04 MED ORDER — DIPHENHYDRAMINE HCL 25 MG PO CAPS: 25.0000 mg | ORAL_CAPSULE | Freq: Four times a day (QID) | ORAL | Status: DC | PRN

## 2018-03-04 MED ORDER — OXYCODONE-ACETAMINOPHEN 5-325 MG PO TABS
1.0000 | ORAL_TABLET | Freq: Three times a day (TID) | ORAL | Status: DC | PRN
  Administered 2018-03-04 (×2): 1 via ORAL
  Filled 2018-03-04 (×2): qty 1

## 2018-03-04 MED ORDER — COCONUT OIL OIL: 1.0000 "application " | TOPICAL_OIL | Status: DC | PRN

## 2018-03-04 MED ORDER — ZOLPIDEM TARTRATE 5 MG PO TABS: 5.0000 mg | ORAL_TABLET | Freq: Every evening | ORAL | Status: DC | PRN

## 2018-03-04 MED ORDER — PRENATAL MULTIVITAMIN CH
1.0000 | ORAL_TABLET | Freq: Every day | ORAL | Status: DC
  Administered 2018-03-04 – 2018-03-05 (×2): 1 via ORAL
  Filled 2018-03-04 (×2): qty 1

## 2018-03-04 MED ORDER — ONDANSETRON HCL 4 MG/2ML IJ SOLN: 4.0000 mg | INTRAMUSCULAR | Status: DC | PRN

## 2018-03-04 MED ORDER — DIBUCAINE 1 % RE OINT: 1.0000 "application " | TOPICAL_OINTMENT | RECTAL | Status: DC | PRN

## 2018-03-04 MED ORDER — ACETAMINOPHEN 325 MG PO TABS: 650.0000 mg | ORAL_TABLET | ORAL | Status: DC | PRN

## 2018-03-04 MED ORDER — WITCH HAZEL-GLYCERIN EX PADS: 1.0000 "application " | MEDICATED_PAD | CUTANEOUS | Status: DC | PRN

## 2018-03-04 MED ORDER — SENNOSIDES-DOCUSATE SODIUM 8.6-50 MG PO TABS
2.0000 | ORAL_TABLET | ORAL | Status: DC
  Administered 2018-03-05: 2 via ORAL
  Filled 2018-03-04 (×2): qty 2

## 2018-03-04 NOTE — Progress Notes (Signed)

## 2018-03-04 NOTE — Progress Notes (Signed)
Post Partum Day 0 Subjective: no complaints, up ad lib, voiding and tolerating PO  Objective: Vitals:   03/04/18 0030 03/04/18 0120 03/04/18 0230 03/04/18 0634  BP: 116/73 119/83 117/73 117/76  Pulse: 79 84 70 93  Resp:  18 20 18   Temp:  98 F (36.7 C) 97.9 F (36.6 C) 98.4 F (36.9 C)  TempSrc:  Oral Axillary Oral  SpO2:      Weight:      Height:        Physical Exam:  General: alert and cooperative Lochia: appropriate Uterine Fundus: firm Incision: n/a DVT Evaluation: No evidence of DVT seen on physical exam. Negative Homan's sign. No cords or calf tenderness. No significant calf/ankle edema.  Recent Labs    03/03/18 1533 03/04/18 0530  HGB 11.4* 10.9*  HCT 35.1* 33.0*    Assessment/Plan: Breastfeeding, plan for discharge tomorrow or next day, patient choice    LOS: 1 day   Janeece Riggersllis K Greer 03/04/2018, 8:48 AM

## 2018-03-04 NOTE — Lactation Note (Signed)
This note was copied from a baby's chart. Lactation Consultation Note  Patient Name: Girl Rulon SeraMaya Kinkaid WUXLK'GToday's Date: 03/04/2018 Reason for consult: Initial assessment  Infant female 5521 hours old P3 experienced  BF woman who previously breastfeed other children for one year and three years. When LC entered room Mom in side-lying position in process of latching  infant to left  breast. Infant latched well with audible swallowing latch score of 9. Mom demonstrated compressions when infant was latched to breast.  LC rolled towel behind infant back and discussed with Mom importance of head and back support for infant. Mom receptive of LC suggestion and seemed pleased. Mom knowledgeable of STS, compression with breastfeeding.  LC services discussed: LC hot line, outpatient clinic, BF support group and other community resources.   Maternal Data    Feeding Feeding Type: Breast Fed  LATCH Score Latch: Grasps breast easily, tongue down, lips flanged, rhythmical sucking.  Audible Swallowing: Spontaneous and intermittent  Type of Nipple: Everted at rest and after stimulation  Comfort (Breast/Nipple): Soft / non-tender  Hold (Positioning): Assistance needed to correctly position infant at breast and maintain latch.  LATCH Score: 9  Interventions Interventions: Support pillows;Skin to skin;Hand express;Breast massage  Lactation Tools Discussed/Used     Consult Status Consult Status: Follow-up Date: 03/05/18 Follow-up type: In-patient    Danelle EarthlyRobin Jannett Schmall 03/04/2018, 8:46 PM

## 2018-03-04 NOTE — Anesthesia Postprocedure Evaluation (Signed)
Anesthesia Post Note  Patient: Sara Estes  Procedure(s) Performed: AN AD HOC LABOR EPIDURAL     Patient location during evaluation: Mother Baby Anesthesia Type: Epidural Level of consciousness: awake Pain management: pain level controlled Vital Signs Assessment: post-procedure vital signs reviewed and stable Respiratory status: spontaneous breathing Cardiovascular status: stable Postop Assessment: no headache, no backache, epidural receding, patient able to bend at knees, no apparent nausea or vomiting, adequate PO intake and able to ambulate Anesthetic complications: no    Last Vitals:  Vitals:   03/04/18 0230 03/04/18 0634  BP: 117/73 117/76  Pulse: 70 93  Resp: 20 18  Temp: 36.6 C 36.9 C  SpO2:      Last Pain:  Vitals:   03/04/18 0634  TempSrc: Oral  PainSc: 0-No pain   Pain Goal:                 [name]

## 2018-03-05 MED ORDER — IBUPROFEN 600 MG PO TABS: 600.0000 mg | ORAL_TABLET | Freq: Four times a day (QID) | ORAL | 0 refills | Status: DC

## 2018-03-05 NOTE — Lactation Note (Signed)
This note was copied from a baby's chart. Lactation Consultation Note  Patient Name: Sara Estes MWNUU'VToday's Date: 03/05/2018 Reason for consult: Follow-up assessment;Term   Follow up with exp BF mom of 35 hour old infant. Infant with 8 BF for 10-20 minutes, 2 BF attempt, 3 voids and 2 stools in the last 24 hours. Infant weight 6 pounds 10.2 ounces with 4% weight loss since birth. LATCH scores 9-10.   Mom reports infant has been cluster feeding and is now in a deep sleep. Mom reports her breasts are fuller and she was able to express colostrum very easily with hand expression. Mom reports nipple pain with initial latch that goes away with feeding. Nipple care of EBM followed by coconut oil or Olive Oil discussed.   Enc mom to continue feeding infant STS 8-12 x a day with feeding cues, offering both breasts with each feeding. Enc mom to keep infant awake with stimulation and breast massage/compression with feedings as needed.   Reviewed I/O, signs of dehydration in the infant, signs that infant is getting enough, Engorgement prevention/treatment, pre pumping to soften areola,  comfort pumping post BG,  and breast milk expression and storage. [facility] reviewed, mom aware of OP services, BF Support Groups and LC phone #.   Mom reports she has no questions/concerns at this time. Mom to call with any concerns as needed. Mom has manual pump for home use.    Maternal Data Formula Feeding for Exclusion: No Has patient been taught Hand Expression?: Yes Does the patient have breastfeeding experience prior to this delivery?: Yes  Feeding    LATCH Score                   Interventions Interventions: Breast feeding basics reviewed;Support pillows;Position options;Skin to skin;Breast compression;Hand express;Breast massage;Expressed milk;Hand pump  Lactation Tools Discussed/Used WIC Program: No   Consult Status Consult Status: Complete Follow-up type: Call as  needed    Ed BlalockSharon S Sherlie Boyum 03/05/2018, 11:00 AM

## 2018-03-05 NOTE — Discharge Summary (Signed)
SVD OB Discharge Summary     Patient Name: Sara Estes DOB: [birth year] MRN: [id number]  Date of admission: 03/03/2018 Delivering MD: Kenney Houseman  Date of delivery: SVD Type of delivery: 03/03/2018  Newborn Data: Sex: BG Circumcision: No Live born female  Birth Weight: 6 lb 14.9 oz (3144 g) APGAR: 9, 9  Newborn Delivery   Birth date/time:  03/03/2018 23:06:00 Delivery type:  Vaginal, Spontaneous     Feeding: breast Infant being discharge to home with mother in stable condition.   Admitting diagnosis: 40wks water broke  Intrauterine pregnancy: [redacted]w[redacted]d     Secondary diagnosis:  Active Problems:   PROM (premature rupture of membranes)   SVD (spontaneous vaginal delivery)   Postpartum normal course                                Complications: ROM>24 hours                                                              Intrapartum Procedures: spontaneous vaginal delivery and GBS prophylaxis Postpartum Procedures: none Complications-Operative and Postpartum: none Augmentation: Pitocin   History of Present Illness: Sara Estes is a 27 y.o. female, G3P3003, who presents at [redacted]w[redacted]d weeks gestation. The patient has been followed at  [facility]  Her pregnancy has been complicated by:  Patient Active Problem List   Diagnosis Date Noted  . Postpartum normal course 03/05/2018  . PROM (premature rupture of membranes) 03/03/2018  . SVD (spontaneous vaginal delivery) 03/03/2018    Hospital course:  Induction of Labor With Vaginal Delivery  For PROM 27 y.o. yo G3P3003 at [redacted]w[redacted]d was admitted to the hospital 03/03/2018 for induction of labor.  Indication for induction: PROM.  Patient had an uncomplicated labor course as follows: Membrane Rupture Time/Date: 11:00 PM ,03/02/2018   Intrapartum Procedures: Episiotomy: None [1]                                         Lacerations:  None [1]  Patient had delivery of a Viable infant.  Information  for the patient's newborn:  Naveh, Rickles [[id number]]  Delivery Method: Vag-Spont   03/03/2018  Details of delivery can be found in separate delivery note.  Patient had a routine postpartum course. Patient is discharged home 03/05/18. Postpartum Day # 2 : S/P NSVD due to PROM. Patient up ad lib, denies syncope or dizziness. Reports consuming regular diet without issues and denies N/V. Patient reports  0 bowel movement + passing flatus.  Denies issues with urination and reports bleeding is "small."  Patient is breastfeeding and reports going well.  Desires undecided for postpartum contraception.  Pain is being appropriately managed with use of motrin/tylenol..   Physical exam  Vitals:   03/04/18 1000 03/04/18 1353 03/05/18 0024 03/05/18 0603  BP: 111/78 123/81 (!) 106/57 99/60  Pulse: 75 67 68 70  Resp:  17 20 18   Temp: 98.2 F (36.8 C) 98.4 F (36.9 C) (!) 97.5 F (36.4 C) 98 F (36.7 C)  TempSrc: Oral Oral Oral Oral  SpO2:  Weight:      Height:       General: alert, cooperative and no distress Lochia: appropriate Uterine Fundus: firm Perineum: Intact DVT Evaluation: No evidence of DVT seen on physical exam. Negative Homan's sign. No cords or calf tenderness. No significant calf/ankle edema.  Labs: Lab Results  Component Value Date   WBC 15.6 (H) 03/04/2018   HGB 10.9 (L) 03/04/2018   HCT 33.0 (L) 03/04/2018   MCV 80.7 03/04/2018   PLT 238 03/04/2018   No flowsheet data found.  Date of discharge: 03/05/2018 Discharge Diagnoses: Term Pregnancy-delivered and PROM x24 hours Discharge instruction: per After Visit Summary and "Baby and Me Booklet".  After visit meds:  Allergies as of 03/05/2018   No Known Allergies     Medication List    TAKE these medications   ibuprofen 600 MG tablet Commonly known as:  ADVIL,MOTRIN Take 1 tablet (600 mg total) by mouth every 6 (six) hours.   prenatal multivitamin Tabs tablet Take 1 tablet by mouth daily at 12 noon.        Activity:           pelvic rest Advance as tolerated. Pelvic rest for 6 weeks.  Diet:                routine Medications: PNV, Ibuprofen and Colace Postpartum contraception: Undecided Condition:  Pt discharge to home with baby in stable  Meds: Allergies as of 03/05/2018   No Known Allergies     Medication List    TAKE these medications   ibuprofen 600 MG tablet Commonly known as:  ADVIL,MOTRIN Take 1 tablet (600 mg total) by mouth every 6 (six) hours.   prenatal multivitamin Tabs tablet Take 1 tablet by mouth daily at 12 noon.       Discharge Follow Up:  Follow-up Information    [facility] Follow up in 6 week(s).   Specialty:  Obstetrics and Gynecology Contact information: [address] [place] [phone]           OtsegoJade Kerrie Latour, NP-C, CNM 03/05/2018, 11:23 AM  Dale DurhamMONTANA, Katelynd Blauvelt, FNP

## 2019-03-25 ENCOUNTER — Encounter (INDEPENDENT_AMBULATORY_CARE_PROVIDER_SITE_OTHER): Payer: Self-pay

## 2020-03-27 DIAGNOSIS — J339 Nasal polyp, unspecified: Secondary | ICD-10-CM | POA: Insufficient documentation

## 2021-03-21 ENCOUNTER — Encounter (HOSPITAL_COMMUNITY): Payer: Self-pay

## 2021-03-21 ENCOUNTER — Other Ambulatory Visit: Payer: Self-pay

## 2021-03-21 ENCOUNTER — Emergency Department (HOSPITAL_COMMUNITY)
Admission: EM | Admit: 2021-03-21 | Discharge: 2021-03-21 | Disposition: A | Discharge status: Home / Self Care | Payer: Medicaid Other | Attending: Emergency Medicine | Admitting: Emergency Medicine

## 2021-03-21 DIAGNOSIS — Z20822 Contact with and (suspected) exposure to covid-19: Secondary | ICD-10-CM | POA: Insufficient documentation

## 2021-03-21 DIAGNOSIS — J40 Bronchitis, not specified as acute or chronic: Secondary | ICD-10-CM | POA: Diagnosis not present

## 2021-03-21 DIAGNOSIS — R0789 Other chest pain: Secondary | ICD-10-CM | POA: Diagnosis present

## 2021-03-21 LAB — CBC WITH DIFFERENTIAL/PLATELET
Abs Immature Granulocytes: 0.01 10*3/uL (ref 0.00–0.07)
Basophils Absolute: 0.1 10*3/uL (ref 0.0–0.1)
Basophils Relative: 1 %
Eosinophils Absolute: 0 10*3/uL (ref 0.0–0.5)
Eosinophils Relative: 0 %
HCT: 40.3 % (ref 36.0–46.0)
Hemoglobin: 13 g/dL (ref 12.0–15.0)
Immature Granulocytes: 0 %
Lymphocytes Relative: 18 %
Lymphs Abs: 1.5 10*3/uL (ref 0.7–4.0)
MCH: 28.5 pg (ref 26.0–34.0)
MCHC: 32.3 g/dL (ref 30.0–36.0)
MCV: 88.4 fL (ref 80.0–100.0)
Monocytes Absolute: 0.4 10*3/uL (ref 0.1–1.0)
Monocytes Relative: 5 %
Neutro Abs: 6.2 10*3/uL (ref 1.7–7.7)
Neutrophils Relative %: 76 %
Platelets: 391 10*3/uL (ref 150–400)
RBC: 4.56 MIL/uL (ref 3.87–5.11)
RDW: 13.6 % (ref 11.5–15.5)
WBC: 8.2 10*3/uL (ref 4.0–10.5)
nRBC: 0 % (ref 0.0–0.2)

## 2021-03-21 LAB — COMPREHENSIVE METABOLIC PANEL
ALT: 17 U/L (ref 0–44)
AST: 18 U/L (ref 15–41)
Albumin: 4.8 g/dL (ref 3.5–5.0)
Alkaline Phosphatase: 62 U/L (ref 38–126)
Anion gap: 12 (ref 5–15)
BUN: 11 mg/dL (ref 6–20)
CO2: 26 mmol/L (ref 22–32)
Calcium: 10.3 mg/dL (ref 8.9–10.3)
Chloride: 104 mmol/L (ref 98–111)
Creatinine, Ser: 0.52 mg/dL (ref 0.44–1.00)
GFR, Estimated: >60 mL/min (ref >60)
Glucose, Bld: 95 mg/dL (ref 70–99)
Potassium: 4.2 mmol/L (ref 3.5–5.1)
Sodium: 142 mmol/L (ref 135–145)
Total Bilirubin: 0.7 mg/dL (ref 0.3–1.2)
Total Protein: 8.1 g/dL (ref 6.5–8.1)

## 2021-03-21 LAB — RESP PANEL BY RT-PCR (FLU A&B, COVID) ARPGX2
Influenza A by PCR: NEGATIVE
Influenza B by PCR: NEGATIVE
SARS Coronavirus 2 by RT PCR: NEGATIVE

## 2021-03-21 MED ORDER — PREDNISONE 20 MG PO TABS: 40.0000 mg | ORAL_TABLET | Freq: Every day | ORAL | 0 refills | Status: DC

## 2021-03-21 MED ORDER — ALBUTEROL SULFATE HFA 108 (90 BASE) MCG/ACT IN AERS
4.0000 | INHALATION_SPRAY | Freq: Once | RESPIRATORY_TRACT | Status: AC
  Administered 2021-03-21: 4 via RESPIRATORY_TRACT
  Filled 2021-03-21: qty 6.7

## 2021-03-21 MED ORDER — PREDNISONE 20 MG PO TABS
40.0000 mg | ORAL_TABLET | Freq: Once | ORAL | Status: AC
Start: 2021-03-21 — End: 2021-03-21
  Administered 2021-03-21: 40 mg via ORAL
  Filled 2021-03-21: qty 2

## 2021-03-21 NOTE — ED Provider Notes (Signed)
Emergency Medicine Provider Triage Evaluation Note  Sara Estes , a 30 y.o. female  was evaluated in triage.  Pt complains of chest tightness and SOB for the last three days. Went to UC and had CXR and EKG done. Diagnosed bronchitis. Has not picked up medications yet. Worse when she lays down. No fevers. Does admit to cough, congestion. No sore throat. Did have recent COVID exposure. Was vaccinated.   Review of Systems  Positive: CP, cough. SOB Negative: fevers  Physical Exam  BP 114/83 (BP Location: Left Arm)   Pulse 87   Temp 98.2 F (36.8 C) (Oral)   Resp 16   Ht 5\' 8"  (1.727 m)   Wt 65.8 kg   LMP 03/02/2021 (Approximate)   SpO2 100%   BMI 22.05 kg/m  Gen:   Awake, no distress   Resp:  Normal effort  MSK:   Moves extremities without difficulty  Other:    Medical Decision Making  Medically screening exam initiated at 6:02 PM.  Appropriate orders placed.  Annah Jasko was informed that the remainder of the evaluation will be completed by another provider, this initial triage assessment does not replace that evaluation, and the importance of remaining in the ED until their evaluation is complete.     Rulon Sera, PA-C 03/21/21 1807    03/23/21, MD 03/21/21 [phone]

## 2021-03-21 NOTE — ED Triage Notes (Signed)
Patient reports that she began having chest tightness x 2-3 days ago. Patient went to an UC prior to coming to the ED.  Patient had an EXG, CXR. Patient was prescribed Medrol dose pak and Monodox for bronchitis.  Patient states, "My prescription was not ready and I did not feel safe lying down at home. " Patient states she took Advil Cold and sinus prior to coming to the ED.

## 2021-03-21 NOTE — ED Provider Notes (Signed)
[facility]-EMERGENCY DEPT Provider Note   CSN: [id number] Arrival date & time: 03/21/21  1714     History No chief complaint on file.   Sara Estes is a 30 y.o. female.  Patient is a 30 year old female with no significant medical history presenting today with complaints of chest tightness, shortness of breath in the setting of URI symptoms for the last 8 days.  It initially started with cough, congestion with lots of mucus and sore throat after her child was sick initially.  However over the last 3 to 4 days the nasal congestion, sore throat has improved but the cough and now intermittent chest pain and tightness has continued to worsen.  She feels like she is restricted when she takes a deep breath.  She is not had any chest pain.  Laying down seems to make the symptoms worse.  She did go to urgent care today and they gave her a prescription for doxycycline, methylprednisolone and Tessalon Perles but she was still having the shortness of breath and felt like she needed something more emergently to help her breathe better.  She has had no unilateral leg pain or swelling.  No recent travel or immobilization.  No prior history of clots.  The history is provided by the patient and medical records.      Past Medical History:  Diagnosis Date   Chlamydia     Patient Active Problem List   Diagnosis Date Noted   Postpartum normal course 03/05/2018   PROM (premature rupture of membranes) 03/03/2018   SVD (spontaneous vaginal delivery) 03/03/2018    Past Surgical History:  Procedure Laterality Date   NO PAST SURGERIES       OB History     Gravida  3   Para  3   Term  3   Preterm      AB      Living  3      SAB      IAB      Ectopic      Multiple  0   Live Births  3           No family history on file.  Social History   Tobacco Use   Smoking status: Never   Smokeless tobacco: Never  Vaping Use   Vaping Use: Never used  Substance  Use Topics   Alcohol use: No   Drug use: No    Home Medications Prior to Admission medications   Medication Sig Start Date End Date Taking? Authorizing Provider  predniSONE (DELTASONE) 20 MG tablet Take 2 tablets (40 mg total) by mouth daily. 03/21/21  Yes Gwyneth Sprout, MD  ibuprofen (ADVIL,MOTRIN) 600 MG tablet Take 1 tablet (600 mg total) by mouth every 6 (six) hours. 03/05/18   Dale [place], FNP  Prenatal Vit-Fe Fumarate-FA (PRENATAL MULTIVITAMIN) TABS tablet Take 1 tablet by mouth daily at 12 noon.    [provider]    Allergies    Claritin [loratadine]  Review of Systems   Review of Systems  All other systems reviewed and are negative.  Physical Exam Updated Vital Signs BP 120/80 (BP Location: Right Arm)   Pulse 80   Temp 98.2 F (36.8 C) (Oral)   Resp 15   Ht 5\' 8"  (1.727 m)   Wt 65.8 kg   LMP 03/02/2021 (Approximate)   SpO2 96%   BMI 22.05 kg/m   Physical Exam Vitals and nursing note reviewed.  Constitutional:  General: She is not in acute distress.    Appearance: Normal appearance. She is well-developed and normal weight.  HENT:     Head: Normocephalic and atraumatic.  Eyes:     Pupils: Pupils are equal, round, and reactive to light.  Cardiovascular:     Rate and Rhythm: Normal rate and regular rhythm.     Heart sounds: Normal heart sounds. No murmur heard.   No friction rub.  Pulmonary:     Effort: Pulmonary effort is normal.     Breath sounds: Normal breath sounds. No wheezing or rales.  Abdominal:     General: Bowel sounds are normal. There is no distension.     Palpations: Abdomen is soft.     Tenderness: There is no abdominal tenderness. There is no guarding or rebound.  Musculoskeletal:        General: No tenderness. Normal range of motion.     Right lower leg: No edema.     Left lower leg: No edema.     Comments: No edema  Skin:    General: Skin is warm and dry.     Findings: No rash.  Neurological:     Mental Status: She  is alert and oriented to person, place, and time. Mental status is at baseline.     Cranial Nerves: No cranial nerve deficit.  Psychiatric:        Mood and Affect: Mood normal.        Behavior: Behavior normal.    ED Results / Procedures / Treatments   Labs (all labs ordered are listed, but only abnormal results are displayed) Labs Reviewed  RESP PANEL BY RT-PCR (FLU A&B, COVID) [id number]  COMPREHENSIVE METABOLIC PANEL  CBC WITH DIFFERENTIAL/PLATELET    EKG EKG Interpretation  Date/Time:  Thursday March 21 2021 19:34:51 EDT Ventricular Rate:  84 PR Interval:  150 QRS Duration: 70 QT Interval:  350 QTC Calculation: 413 R Axis:   84 Text Interpretation: Normal sinus rhythm with sinus arrhythmia Normal ECG No previous tracing Confirmed by Gwyneth Sprout ([id number]) on 03/21/2021 7:42:38 PM  Radiology No results found.  Procedures Procedures   Medications Ordered in ED Medications  albuterol (VENTOLIN HFA) 108 (90 Base) MCG/ACT inhaler 4 puff (4 puffs Inhalation Given 03/21/21 1953)  predniSONE (DELTASONE) tablet 40 mg (40 mg Oral Given 03/21/21 1953)    ED Course  I have reviewed the triage vital signs and the nursing notes.  Pertinent labs & imaging results that were available during my care of the patient were reviewed by me and considered in my medical decision making (see chart for details).    MDM Rules/Calculators/A&P                           Patient presenting today with complaints of chest tightness and shortness of breath in the setting of recent URI.  Patient is well-appearing on exam and satting greater than 95% on room air.  PERC is negative, EKG is within normal limits, prior to evaluation patient had a CBC and CMP done which were both within normal limits without evidence of renal issues or anemia.  Patient had a chest x-ray done at urgent care today which was within normal limits.  Suspect a component of bronchial spasm given patient's report.  Low suspicion for  PE, dissection, ACS, myocarditis, pericarditis or pericardial effusion.  No abdominal symptoms at this time.  Patient given 4 puffs of an inhaler on repeat evaluation  she is feeling much better.  Feel the patient is stable for discharge and treatment for bronchitis.  She is breast-feeding and recommended she stop breast-feeding while taking the antibiotic.  MDM   Amount and/or Complexity of Data Reviewed Clinical lab tests: ordered and reviewed Tests in the medicine section of CPT: ordered and reviewed Independent visualization of images, tracings, or specimens: yes    Final Clinical Impression(s) / ED Diagnoses Final diagnoses:  Bronchitis    Rx / DC Orders ED Discharge Orders          Ordered    predniSONE (DELTASONE) 20 MG tablet  Daily        03/21/21 2047             Gwyneth Sprout, MD 03/21/21 2120

## 2021-03-21 NOTE — Discharge Instructions (Addendum)
You can use the inhaler every 4-6 hours 2 puffs as needed for the tightness.  Instead of doing the Medrol Dosepak you can just do the 5 days of prednisone.  Also use the doxycycline antibiotic for bronchitis but you will need to avoid breastfeeding.

## 2021-05-19 ENCOUNTER — Emergency Department (HOSPITAL_COMMUNITY)
Admission: EM | Admit: 2021-05-19 | Discharge: 2021-05-19 | Disposition: A | Discharge status: Home / Self Care | Payer: Medicaid Other | Attending: Emergency Medicine | Admitting: Emergency Medicine

## 2021-05-19 ENCOUNTER — Encounter (HOSPITAL_COMMUNITY): Payer: Self-pay | Admitting: Emergency Medicine

## 2021-05-19 ENCOUNTER — Other Ambulatory Visit: Payer: Self-pay

## 2021-05-19 DIAGNOSIS — E876 Hypokalemia: Secondary | ICD-10-CM | POA: Diagnosis not present

## 2021-05-19 DIAGNOSIS — R109 Unspecified abdominal pain: Secondary | ICD-10-CM | POA: Diagnosis not present

## 2021-05-19 DIAGNOSIS — O219 Vomiting of pregnancy, unspecified: Secondary | ICD-10-CM | POA: Diagnosis not present

## 2021-05-19 DIAGNOSIS — R630 Anorexia: Secondary | ICD-10-CM | POA: Insufficient documentation

## 2021-05-19 LAB — I-STAT BETA HCG BLOOD, ED (MC, WL, AP ONLY): I-stat hCG, quantitative: >2000 m[IU]/mL — ABNORMAL HIGH (ref <5)

## 2021-05-19 LAB — CBC WITH DIFFERENTIAL/PLATELET
Abs Immature Granulocytes: 0.02 10*3/uL (ref 0.00–0.07)
Basophils Absolute: 0.1 10*3/uL (ref 0.0–0.1)
Basophils Relative: 1 %
Eosinophils Absolute: 0.2 10*3/uL (ref 0.0–0.5)
Eosinophils Relative: 3 %
HCT: 37.1 % (ref 36.0–46.0)
Hemoglobin: 12.2 g/dL (ref 12.0–15.0)
Immature Granulocytes: 0 %
Lymphocytes Relative: 21 %
Lymphs Abs: 1.6 10*3/uL (ref 0.7–4.0)
MCH: 28.5 pg (ref 26.0–34.0)
MCHC: 32.9 g/dL (ref 30.0–36.0)
MCV: 86.7 fL (ref 80.0–100.0)
Monocytes Absolute: 0.5 10*3/uL (ref 0.1–1.0)
Monocytes Relative: 6 %
Neutro Abs: 5.4 10*3/uL (ref 1.7–7.7)
Neutrophils Relative %: 69 %
Platelets: 246 10*3/uL (ref 150–400)
RBC: 4.28 MIL/uL (ref 3.87–5.11)
RDW: 12.9 % (ref 11.5–15.5)
WBC: 7.8 10*3/uL (ref 4.0–10.5)
nRBC: 0 % (ref 0.0–0.2)

## 2021-05-19 LAB — COMPREHENSIVE METABOLIC PANEL
ALT: 13 U/L (ref 0–44)
AST: 17 U/L (ref 15–41)
Albumin: 3.7 g/dL (ref 3.5–5.0)
Alkaline Phosphatase: 39 U/L (ref 38–126)
Anion gap: 9 (ref 5–15)
BUN: 7 mg/dL (ref 6–20)
CO2: 21 mmol/L — ABNORMAL LOW (ref 22–32)
Calcium: 9.1 mg/dL (ref 8.9–10.3)
Chloride: 104 mmol/L (ref 98–111)
Creatinine, Ser: 0.65 mg/dL (ref 0.44–1.00)
GFR, Estimated: >60 mL/min (ref >60)
Glucose, Bld: 106 mg/dL — ABNORMAL HIGH (ref 70–99)
Potassium: 3.4 mmol/L — ABNORMAL LOW (ref 3.5–5.1)
Sodium: 134 mmol/L — ABNORMAL LOW (ref 135–145)
Total Bilirubin: 0.7 mg/dL (ref 0.3–1.2)
Total Protein: 6.8 g/dL (ref 6.5–8.1)

## 2021-05-19 LAB — LIPASE, BLOOD: Lipase: 31 U/L (ref 11–51)

## 2021-05-19 MED ORDER — DOXYLAMINE-PYRIDOXINE 10-10 MG PO TBEC: 1.0000 | DELAYED_RELEASE_TABLET | Freq: Two times a day (BID) | ORAL | 0 refills | Status: DC | PRN

## 2021-05-19 NOTE — Discharge Instructions (Addendum)
Your pregnancy test came back positive.  I have written your prescription for Diclegis which is used in pregnancy for nausea.  Please follow-up with your OB/GYN I have also written a list of medications below that are safe in pregnancy. As we discussed nausea and vomiting in pregnancy is a very common symptom.  Hopefully Diclegis/B vitamin/doxylamine will help mitigate your symptoms.  Your OB/GYN may make additional recommendations.  Safe Medications in Pregnancy   Acne: Benzoyl Peroxide Salicylic Acid  Backache/Headache: Tylenol: 2 regular strength every 4 hours OR              2 Extra strength every 6 hours  Colds/Coughs/Allergies: Benadryl (alcohol free) 25 mg every 6 hours as needed Breath right strips Claritin Cepacol throat lozenges Chloraseptic throat spray Cold-Eeze- up to three times per day Cough drops, alcohol free Flonase (by prescription only) Guaifenesin Mucinex Robitussin DM (plain only, alcohol free) Saline nasal spray/drops Sudafed (pseudoephedrine) & Actifed ** use only after [redacted] weeks gestation and if you do not have high blood pressure Tylenol Vicks Vaporub Zinc lozenges Zyrtec   Constipation: Colace Ducolax suppositories Fleet enema Glycerin suppositories Metamucil Milk of magnesia Miralax Senokot Smooth move tea  Diarrhea: Kaopectate Imodium A-D  *NO pepto Bismol  Hemorrhoids: Anusol Anusol HC Preparation H Tucks  Indigestion: Tums Maalox Mylanta Zantac  Pepcid  Insomnia: Benadryl (alcohol free) 25mg  every 6 hours as needed Tylenol PM Unisom, no Gelcaps  Leg Cramps: Tums MagGel  Nausea/Vomiting:  Bonine Dramamine Emetrol Ginger extract Sea bands Meclizine  Nausea medication to take during pregnancy:  Unisom (doxylamine succinate 25 mg tablets) Take one tablet daily at bedtime. If symptoms are not adequately controlled, the dose can be increased to a maximum recommended dose of two tablets daily (1/2 tablet in the  morning, 1/2 tablet mid-afternoon and one at bedtime). Vitamin B6 100mg  tablets. Take one tablet twice a day (up to 200 mg per day).  Skin Rashes: Aveeno products Benadryl cream or 25mg  every 6 hours as needed Calamine Lotion 1% cortisone cream  Yeast infection: Gyne-lotrimin 7 Monistat 7  Gum/tooth pain: Anbesol  **If taking multiple medications, please check labels to avoid duplicating the same active ingredients **take medication as directed on the label ** Do not exceed 4000 mg of tylenol in 24 hours **Do not take medications that contain aspirin or ibuprofen

## 2021-05-19 NOTE — ED Provider Notes (Signed)
[facility] EMERGENCY DEPARTMENT Provider Note   CSN: [id number] Arrival date & time: 05/19/21  1423     History Chief Complaint  Patient presents with   Nausea    Sara Estes is a 30 y.o. female.  HPI Patient is a 30 year old female with past medical history significant for 4  full term pregnancies  She is presenting to the ER today with complaints of nausea over the past 3 to 4 days.  She states that is been intermittent she states that she has been eating somewhat less because of a decreased appetite because of her nausea.  Denies any vomiting no diarrhea no abdominal pain chest pain shortness of breath lightheadedness or dizziness.  She states that she has had slightly harder stools recently.  Denies any urinary frequency urgency or dysuria.    Past Medical History:  Diagnosis Date   Chlamydia     Patient Active Problem List   Diagnosis Date Noted   Postpartum normal course 03/05/2018   PROM (premature rupture of membranes) 03/03/2018   SVD (spontaneous vaginal delivery) 03/03/2018    Past Surgical History:  Procedure Laterality Date   NO PAST SURGERIES       OB History     Gravida  3   Para  3   Term  3   Preterm      AB      Living  3      SAB      IAB      Ectopic      Multiple  0   Live Births  3           No family history on file.  Social History   Tobacco Use   Smoking status: Never   Smokeless tobacco: Never  Vaping Use   Vaping Use: Never used  Substance Use Topics   Alcohol use: No   Drug use: No    Home Medications Prior to Admission medications   Medication Sig Start Date End Date Taking? Authorizing Provider  Doxylamine-Pyridoxine (DICLEGIS) 10-10 MG TBEC Take 1 tablet by mouth 2 (two) times daily as needed (nausea/vomiting). 05/19/21  Yes Azim Gillingham S, PA  ibuprofen (ADVIL,MOTRIN) 600 MG tablet Take 1 tablet (600 mg total) by mouth every 6 (six) hours. 03/05/18   [place], Lesly Rubenstein, FNP   predniSONE (DELTASONE) 20 MG tablet Take 2 tablets (40 mg total) by mouth daily. 03/21/21   Gwyneth Sprout, MD  Prenatal Vit-Fe Fumarate-FA (PRENATAL MULTIVITAMIN) TABS tablet Take 1 tablet by mouth daily at 12 noon.    [provider]    Allergies    Claritin [loratadine]  Review of Systems   Review of Systems  Constitutional:  Negative for chills and fever.  HENT:  Negative for congestion.   Eyes:  Negative for pain.  Respiratory:  Negative for cough and shortness of breath.   Cardiovascular:  Negative for chest pain and leg swelling.  Gastrointestinal:  Positive for nausea. Negative for abdominal pain and vomiting.  Genitourinary:  Negative for dysuria.  Musculoskeletal:  Negative for myalgias.  Skin:  Negative for rash.  Neurological:  Negative for dizziness and headaches.   Physical Exam Updated Vital Signs BP 119/79 (BP Location: Right Arm)   Pulse 72   Temp 97.9 F (36.6 C) (Oral)   Resp 14   Ht 5\' 8"  (1.727 m)   Wt 69.9 kg   SpO2 100%   BMI 23.42 kg/m   Physical Exam Vitals and nursing  note reviewed.  Constitutional:      General: She is not in acute distress.    Comments: Pleasant well-appearing 30 year old.  In no acute distress.  Sitting comfortably in bed.  Able answer questions appropriately follow commands. No increased work of breathing. Speaking in full sentences.   HENT:     Head: Normocephalic and atraumatic.     Nose: Nose normal.  Eyes:     General: No scleral icterus. Cardiovascular:     Rate and Rhythm: Normal rate and regular rhythm.     Pulses: Normal pulses.     Heart sounds: Normal heart sounds.  Pulmonary:     Effort: Pulmonary effort is normal. No respiratory distress.     Breath sounds: No wheezing.  Abdominal:     Palpations: Abdomen is soft.     Tenderness: There is no abdominal tenderness. There is no guarding or rebound.     Comments: Abdomen soft nontender no guarding or rebound.  Musculoskeletal:     Cervical back:  Normal range of motion.     Right lower leg: No edema.     Left lower leg: No edema.  Skin:    General: Skin is warm and dry.     Capillary Refill: Capillary refill takes less than 2 seconds.  Neurological:     Mental Status: She is alert. Mental status is at baseline.  Psychiatric:        Mood and Affect: Mood normal.        Behavior: Behavior normal.    ED Results / Procedures / Treatments   Labs (all labs ordered are listed, but only abnormal results are displayed) Labs Reviewed  COMPREHENSIVE METABOLIC PANEL - Abnormal; Notable for the following components:      Result Value   Sodium 134 (*)    Potassium 3.4 (*)    CO2 21 (*)    Glucose, Bld 106 (*)    All other components within normal limits  I-STAT BETA HCG BLOOD, ED ([facility], [facility], [facility] ONLY) - Abnormal; Notable for the following components:   I-stat hCG, quantitative >2,000.0 (*)    All other components within normal limits  LIPASE, BLOOD  CBC WITH DIFFERENTIAL/PLATELET    EKG None  Radiology No results found.  Procedures Procedures   Medications Ordered in ED Medications - No data to display  ED Course  I have reviewed the triage vital signs and the nursing notes.  Pertinent labs & imaging results that were available during my care of the patient were reviewed by me and considered in my medical decision making (see chart for details).  Clinical Course as of 05/19/21 1735  Sun May 19, 2021  1638 Pt without symptoms of nausea vomiting or abd pain currently (on recheck).  Will DC home now. She has tolerated water here in ER. No sx. Abd still soft and non-tender to palp.   VSWNLs    I personally reviewed all laboratory work and imaging.  Metabolic panel without any acute abnormality specifically kidney function within normal limits and no significant electrolyte abnormalities (mild low K+) will provide anti-emetic and recommend hydration.  CBC without leukocytosis or significant anemia.  [WF]  1639 I-stat hCG,  quantitative(!): >2,000.0 [WF]  1639 Briefly discussed with Dr. Rubin Payor prior to DC [WF]    Clinical Course User Index [WF] Gailen Shelter, PA   MDM Rules/Calculators/A&P  Patient feels well at this time has no symptoms.  Apart from mild hypokalemia no significant abnormalities on work-up  She states she has no nausea or vomiting at this time.  She appears to be pregnant.  She is not having vaginal bleeding vaginal discharge or pain or nausea  Will discharge home with nausea medicine from declegis to use as needed.  Return precautions given.  We will follow-up with her OB/GYN.  Final Clinical Impression(s) / ED Diagnoses Final diagnoses:  Nausea/vomiting in pregnancy    Rx / DC Orders ED Discharge Orders          Ordered    Doxylamine-Pyridoxine (DICLEGIS) 10-10 MG TBEC  2 times daily PRN        05/19/21 1619             Gailen Shelter, PA 05/19/21 1736    Benjiman Core, MD 05/20/21 [phone]

## 2021-05-19 NOTE — ED Triage Notes (Signed)
Pt reports nausea x 5-6 days.  Denies pain but states she occasionally has "hunger pain".  Unknown LMP- doesn't remember

## 2021-05-19 NOTE — ED Provider Notes (Signed)
Emergency Medicine Provider Triage Evaluation Note  Island Dohmen , a 30 y.o. female  was evaluated in triage.  Pt complains of nausea for several days.  Reports hunger pains.  Denying any vomiting, urinary symptoms.  May be slight constipation.  Minimal improvement noted with Pepto-Bismol  Review of Systems  Positive: Nausea, constipation Negative: Vomiting   Physical Exam  BP 101/62 (BP Location: Left Arm)   Pulse 78   Temp 98.7 F (37.1 C) (Oral)   Resp 14   SpO2 100%  Gen:   Awake, no distress   Resp:  Normal effort  MSK:   Moves extremities without difficulty  Other:  Abdomen is soft  Medical Decision Making  Medically screening exam initiated at 2:40 PM.  Appropriate orders placed.  Dim Meisinger was informed that the remainder of the evaluation will be completed by another provider, this initial triage assessment does not replace that evaluation, and the importance of remaining in the ED until their evaluation is complete.  Lab work ordered   Dietrich Pates, PA-C 05/19/21 1443    Vanetta Mulders, MD 05/27/21 1319

## 2021-06-28 LAB — OB RESULTS CONSOLE ABO/RH: RH Type: POSITIVE

## 2021-06-28 LAB — OB RESULTS CONSOLE RUBELLA ANTIBODY, IGM: Rubella: IMMUNE

## 2021-06-28 LAB — OB RESULTS CONSOLE GC/CHLAMYDIA: Chlamydia: NEGATIVE

## 2021-06-28 LAB — OB RESULTS CONSOLE HEPATITIS B SURFACE ANTIGEN: Hepatitis B Surface Ag: NEGATIVE

## 2021-06-28 LAB — OB RESULTS CONSOLE HIV ANTIBODY (ROUTINE TESTING): HIV: NONREACTIVE

## 2021-08-25 NOTE — L&D Delivery Note (Signed)
Delivery Note Labor onset:   01/14/22 Labor Onset Time: 0100 Complete dilation at 5:34 AM  Onset of pushing at 0610 FHR second stage Cat I and II Analgesia/Anesthesia intrapartum: unmedicated  Guided pushing with maternal urge. Delivery of a viable female w/ left foot smaller than right at [phone]. Fetal head delivered in LOP position.  Nuchal cord: none.  Infant placed on maternal abd, dried, and tactile stim.  Cord double clamped after 10 min and cut by patient-Bernisha.  RN x2 present for birth.  Cord blood sample collected: Yes Arterial cord blood sample collected: No  Placenta delivered Tomasa Blase, intact, with 3 VC.  Placenta to L&D. Uterine tone firm, bleeding scant  No laceration identified.  EBL (mL): 50 Complications: none APGAR: APGAR (1 MIN): 9   APGAR (5 MINS): 9   APGAR (10 MINS):   Mom to postpartum.  Baby to Couplet care / Skin to Skin Baby girl w/ left foot abnormality, peds to follow  Roma Schanz MSN, CNM 01/14/2022, 7:50 AM

## 2021-09-09 ENCOUNTER — Other Ambulatory Visit: Payer: Self-pay

## 2021-09-09 DIAGNOSIS — O0930 Supervision of pregnancy with insufficient antenatal care, unspecified trimester: Secondary | ICD-10-CM | POA: Insufficient documentation

## 2021-10-22 DIAGNOSIS — O359XX0 Maternal care for (suspected) fetal abnormality and damage, unspecified, not applicable or unspecified: Secondary | ICD-10-CM | POA: Insufficient documentation

## 2021-10-23 ENCOUNTER — Other Ambulatory Visit: Payer: Self-pay

## 2021-10-23 ENCOUNTER — Inpatient Hospital Stay (HOSPITAL_COMMUNITY)
Admission: AD | Admit: 2021-10-23 | Discharge: 2021-10-24 | Disposition: A | Discharge status: Home / Self Care | Payer: Medicaid Other | Attending: Obstetrics and Gynecology | Admitting: Obstetrics and Gynecology

## 2021-10-23 ENCOUNTER — Encounter (HOSPITAL_COMMUNITY): Payer: Self-pay | Admitting: Obstetrics and Gynecology

## 2021-10-23 DIAGNOSIS — R42 Dizziness and giddiness: Secondary | ICD-10-CM | POA: Diagnosis present

## 2021-10-23 DIAGNOSIS — O99343 Other mental disorders complicating pregnancy, third trimester: Secondary | ICD-10-CM | POA: Insufficient documentation

## 2021-10-23 DIAGNOSIS — R55 Syncope and collapse: Secondary | ICD-10-CM | POA: Diagnosis not present

## 2021-10-23 DIAGNOSIS — O26893 Other specified pregnancy related conditions, third trimester: Secondary | ICD-10-CM | POA: Diagnosis not present

## 2021-10-23 DIAGNOSIS — F419 Anxiety disorder, unspecified: Secondary | ICD-10-CM

## 2021-10-23 DIAGNOSIS — O99340 Other mental disorders complicating pregnancy, unspecified trimester: Secondary | ICD-10-CM | POA: Diagnosis not present

## 2021-10-23 DIAGNOSIS — Z3A29 29 weeks gestation of pregnancy: Secondary | ICD-10-CM | POA: Diagnosis not present

## 2021-10-23 LAB — URINALYSIS, ROUTINE W REFLEX MICROSCOPIC
Bilirubin Urine: NEGATIVE
Glucose, UA: NEGATIVE mg/dL
Hgb urine dipstick: NEGATIVE
Ketones, ur: NEGATIVE mg/dL
Nitrite: NEGATIVE
Protein, ur: NEGATIVE mg/dL
Specific Gravity, Urine: 1.003 — ABNORMAL LOW (ref 1.005–1.030)
pH: 7 (ref 5.0–8.0)

## 2021-10-23 MED ORDER — LACTATED RINGERS IV BOLUS
1000.0000 mL | Freq: Once | INTRAVENOUS | Status: AC
Start: 2021-10-23 — End: 2021-10-24
  Administered 2021-10-23: 1000 mL via INTRAVENOUS

## 2021-10-23 NOTE — MAU Note (Signed)
Pt reports to MAU c/o around 2100 I started feeling faint.. I passed out 2 weeks ago and I didn't want to pass out again. Got SOB and eyes felt heavy. States she used her son's inhaler before she came here at 2115. Denies pain, VB, or LOF. +FM.  ? ?  ?

## 2021-10-23 NOTE — MAU Provider Note (Signed)
?History  ?  ? ?CSN: [id number] ? ?Arrival date and time: 10/23/21 2128 ? ? None  ?  ? ?Chief Complaint  ?Patient presents with  ? Dizziness  ? ?HPI ?Sara Estes is a 31 y.o. [phone] at 39w2dwho presents to MAU for weakness, dizziness, and near syncopal event. Patient reports around 9pm while she was cooking dinner she started to feel hot, weak and like she was going to pass out. She immediately became anxious and short of breath. She used her son's albuterol inhaler to help with the shortness of breath. She does not feel short of breath at this time, but just feels "panicky". She denies palpitations or chest pain. She reports a similar occurrence happened 2 weeks ago while she was at the dentist office but reports she did pass out briefly. EMS came and evaluated her, but she did not go to hospital. On further questioning, patient reports that she had her GTT this morning and has had an orange, a few bites of a burger, and some mac and cheese to eat today and has had approximately 1 bottle of water. She denies contractions, vaginal bleeding, leaking fluid, N/V/D, or fever. Endorses active fetal movement. [facility] ? ?OB History   ? ? Gravida  ?4  ? Para  ?3  ? Term  ?3  ? Preterm  ?   ? AB  ?   ? Living  ?3  ?  ? ? SAB  ?   ? IAB  ?   ? Ectopic  ?   ? Multiple  ?0  ? Live Births  ?3  ?   ?  ?  ? ? ?Past Medical History:  ?Diagnosis Date  ? Chlamydia   ? ? ?Past Surgical History:  ?Procedure Laterality Date  ? NO PAST SURGERIES    ? ? ?History reviewed. No pertinent family history. ? ?Social History  ? ?Tobacco Use  ? Smoking status: Never  ? Smokeless tobacco: Never  ?Vaping Use  ? Vaping Use: Never used  ?Substance Use Topics  ? Alcohol use: No  ? Drug use: No  ? ? ?Allergies:  ?Allergies  ?Allergen Reactions  ? Claritin [Loratadine]   ? ? ?Medications Prior to Admission  ?Medication Sig Dispense Refill Last Dose  ? Doxylamine-Pyridoxine (DICLEGIS) 10-10 MG TBEC Take 1 tablet by mouth 2 (two) times  daily as needed (nausea/vomiting). 30 tablet 0   ? ibuprofen (ADVIL,MOTRIN) 600 MG tablet Take 1 tablet (600 mg total) by mouth every 6 (six) hours. 30 tablet 0   ? predniSONE (DELTASONE) 20 MG tablet Take 2 tablets (40 mg total) by mouth daily. 10 tablet 0   ? Prenatal Vit-Fe Fumarate-FA (PRENATAL MULTIVITAMIN) TABS tablet Take 1 tablet by mouth daily at 12 noon.     ? ? ?Review of Systems  ?Constitutional: Negative.   ?Respiratory: Negative.    ?Cardiovascular: Negative.   ?Gastrointestinal: Negative.   ?Genitourinary: Negative.   ?Musculoskeletal: Negative.   ?Neurological:  Positive for light-headedness.  ? ?Physical Exam  ? ?Patient Vitals for the past 24 hrs: ? BP Temp Temp src Pulse Resp SpO2 Height Weight  ?10/23/21 2155 119/67 98 ?F (36.7 ?C) Oral 93 20 100 % _0  (1.727 m) 77.1 kg  ? ?Orthostatic VS for the past 24 hrs (Last 3 readings): ? BP- Lying Pulse- Lying BP- Sitting Pulse- Sitting BP- Standing at 0 minutes Pulse- Standing at 0 minutes BP- Standing at 3 minutes Pulse- Standing at 3 minutes  ?  10/23/21 2357 110/65 68 110/65 76 104/62 73 111/63 77  ? ?Physical Exam ?Vitals and nursing note reviewed.  ?Constitutional:   ?   General: She is not in acute distress. ?Eyes:  ?   Extraocular Movements: Extraocular movements intact.  ?   Pupils: Pupils are equal, round, and reactive to light.  ?Cardiovascular:  ?   Rate and Rhythm: Normal rate and regular rhythm.  ?Pulmonary:  ?   Effort: Pulmonary effort is normal. No respiratory distress.  ?   Breath sounds: Normal breath sounds.  ?Abdominal:  ?   Palpations: Abdomen is soft.  ?   Tenderness: There is no abdominal tenderness.  ?Genitourinary: ?   Comments: VE: closed/thick/posterior ?Musculoskeletal:     ?   General: Normal range of motion.  ?   Cervical back: Normal range of motion.  ?Skin: ?   General: Skin is warm and dry.  ?Neurological:  ?   General: No focal deficit present.  ?   Mental Status: She is alert and oriented to person, place, and time.   ?Psychiatric:     ?   Mood and Affect: Mood normal.     ?   Behavior: Behavior normal.     ?   Thought Content: Thought content normal.     ?   Judgment: Judgment normal.  ? ?NST ?FHR: 135 bpm, moderate variability, +15x15 accels, no decels ?Toco: Q3-4 mins ? ?MAU Course  ?Procedures ?NST ?UA ?LR bolus ?CBC, CMP ?EKG ?Orthostatic vitals ? ?MDM ?UA unremarkable, labs reassuring. EKG normal sinus rhythm. VSS. Orthostatic vitals normal. Patient was given LR bolus and reports feeling much better. Patient initially contracting q3-4 minutes, which resolved with IVF's. Patient not feeling contractions. Cervix closed/thick/posterior. After discussing with patient, she reports that she has a history of anxiety and feels like she is "overwhelmed and that her mind is "racing". She has never taken medications for her anxiety but feels like she needs to start. I offered a dose of vistaril to patient prior to discharge, but she declines. She would like an rx sent to her pharmacy.  ? ?Assessment and Plan  ?[redacted] weeks gestation of pregnancy ?Anxiety during pregnancy ?Near syncope ? ?- Discharge home in stable condition ?- Recommend small, frequent meals/snacks. Focus on more protein. Increase water intake.  ?- Rx for Vistaril sent to pharmacy ?- Strict return precautions reviewed. Return to MAU sooner or as needed for worsening symptoms ?- Keep OB appointment as scheduled ? ? ? ?Renee Harder, CNM ?10/24/2021, 1:08 AM  ?

## 2021-10-24 ENCOUNTER — Other Ambulatory Visit: Payer: Self-pay

## 2021-10-24 ENCOUNTER — Encounter (HOSPITAL_COMMUNITY): Payer: Self-pay | Admitting: Obstetrics and Gynecology

## 2021-10-24 ENCOUNTER — Inpatient Hospital Stay (EMERGENCY_DEPARTMENT_HOSPITAL)
Admission: AD | Admit: 2021-10-24 | Discharge: 2021-10-24 | Disposition: A | Discharge status: Home / Self Care | Payer: Medicaid Other | Source: Home / Self Care | Attending: Obstetrics and Gynecology | Admitting: Obstetrics and Gynecology

## 2021-10-24 DIAGNOSIS — E559 Vitamin D deficiency, unspecified: Secondary | ICD-10-CM | POA: Insufficient documentation

## 2021-10-24 DIAGNOSIS — O99343 Other mental disorders complicating pregnancy, third trimester: Secondary | ICD-10-CM | POA: Insufficient documentation

## 2021-10-24 DIAGNOSIS — F419 Anxiety disorder, unspecified: Secondary | ICD-10-CM

## 2021-10-24 DIAGNOSIS — O99340 Other mental disorders complicating pregnancy, unspecified trimester: Secondary | ICD-10-CM

## 2021-10-24 DIAGNOSIS — Z3A29 29 weeks gestation of pregnancy: Secondary | ICD-10-CM | POA: Insufficient documentation

## 2021-10-24 LAB — COMPREHENSIVE METABOLIC PANEL
ALT: 14 U/L (ref 0–44)
AST: 41 U/L (ref 15–41)
Albumin: 2.9 g/dL — ABNORMAL LOW (ref 3.5–5.0)
Alkaline Phosphatase: 86 U/L (ref 38–126)
Anion gap: 11 (ref 5–15)
BUN: 5 mg/dL — ABNORMAL LOW (ref 6–20)
CO2: 21 mmol/L — ABNORMAL LOW (ref 22–32)
Calcium: 8.8 mg/dL — ABNORMAL LOW (ref 8.9–10.3)
Chloride: 104 mmol/L (ref 98–111)
Creatinine, Ser: 0.6 mg/dL (ref 0.44–1.00)
GFR, Estimated: >60 mL/min (ref >60)
Glucose, Bld: 93 mg/dL (ref 70–99)
Potassium: 5.7 mmol/L — ABNORMAL HIGH (ref 3.5–5.1)
Sodium: 136 mmol/L (ref 135–145)
Total Bilirubin: 0.9 mg/dL (ref 0.3–1.2)
Total Protein: 6.6 g/dL (ref 6.5–8.1)

## 2021-10-24 LAB — CBC
HCT: 33.2 % — ABNORMAL LOW (ref 36.0–46.0)
Hemoglobin: 10.7 g/dL — ABNORMAL LOW (ref 12.0–15.0)
MCH: 27.5 pg (ref 26.0–34.0)
MCHC: 32.2 g/dL (ref 30.0–36.0)
MCV: 85.3 fL (ref 80.0–100.0)
Platelets: 301 10*3/uL (ref 150–400)
RBC: 3.89 MIL/uL (ref 3.87–5.11)
RDW: 13.3 % (ref 11.5–15.5)
WBC: 9.5 10*3/uL (ref 4.0–10.5)
nRBC: 0 % (ref 0.0–0.2)

## 2021-10-24 MED ORDER — HYDROXYZINE HCL 25 MG PO TABS
25.0000 mg | ORAL_TABLET | Freq: Once | ORAL | Status: AC
  Administered 2021-10-24: 25 mg via ORAL
  Filled 2021-10-24: qty 1

## 2021-10-24 MED ORDER — HYDROXYZINE PAMOATE 25 MG PO CAPS: 25.0000 mg | ORAL_CAPSULE | Freq: Three times a day (TID) | ORAL | 0 refills | Status: DC | PRN

## 2021-10-24 NOTE — MAU Note (Signed)
Pt was d/c at 0130 came back reporting "I don't feel safe to go home". "I'm feeling like I need to make the effort to breathe." Reports slight tightness in her chest and light headed. States she was told she's slightly anemic and concerned if she needs iron. "Something doesn't feel right."  ?

## 2021-10-24 NOTE — MAU Provider Note (Signed)
?History  ?  ? ?[id number] ? ?Arrival date and time: 10/24/21 0148 ?  ? ?Chief Complaint  ?Patient presents with  ? Shortness of Breath  ? ? ? ?HPI ?Sara Estes is a 31 y.o. at [redacted]w[redacted]d who presents for shortness of breath. ?Patient was evaluated this evening and discharged 30 minutes ago.  Returned prior to going home.  Reports a syncopal episode a few weeks ago.  Today she had 2 episodes of presyncope that made her nervous.  States that she has a history of anxiety that has not been medicated and she believes that the presyncopal feeling she had this evening prompted her to be anxious about possibly passing out. She noticed the shortness of breath occurred with the anxiety. States she has some concern that the symptoms could either be related to anemia or blood clots, but states she thinks more so the cause is related to anxiety.  During her previous MAU visit she was prescribed Vistaril, which she has not had the time to go pick up yet.  At that time she had declined dose of Vistaril to be given in MAU. ?Denies fever, sore throat, cough, chest pain, abdominal pain, vaginal bleeding, calf pain. ? ?OB History   ? ? Gravida  ?4  ? Para  ?3  ? Term  ?3  ? Preterm  ?   ? AB  ?   ? Living  ?3  ?  ? ? SAB  ?   ? IAB  ?   ? Ectopic  ?   ? Multiple  ?0  ? Live Births  ?3  ?   ?  ?  ? ? ?Past Medical History:  ?Diagnosis Date  ? Chlamydia   ? ? ?Past Surgical History:  ?Procedure Laterality Date  ? NO PAST SURGERIES    ? ? ?History reviewed. No pertinent family history. ? ?Allergies  ?Allergen Reactions  ? Claritin [Loratadine]   ? ? ?No current facility-administered medications on file prior to encounter.  ? ?Current Outpatient Medications on File Prior to Encounter  ?Medication Sig Dispense Refill  ? Doxylamine-Pyridoxine (DICLEGIS) 10-10 MG TBEC Take 1 tablet by mouth 2 (two) times daily as needed (nausea/vomiting). 30 tablet 0  ? hydrOXYzine (VISTARIL) 25 MG capsule Take 1 capsule (25 mg total) by mouth 3 (three) times  daily as needed. 30 capsule 0  ? Prenatal Vit-Fe Fumarate-FA (PRENATAL MULTIVITAMIN) TABS tablet Take 1 tablet by mouth daily at 12 noon.    ? ? ? ?ROS ?Pertinent positives and negative per HPI, all others reviewed and negative ? ?Physical Exam  ? ?BP (!) 100/52 (BP Location: Right Arm)   Pulse 79   Temp 98.2 ?F (36.8 ?C) (Oral)   Resp 18   LMP 03/02/2021 (Approximate)   SpO2 100%  ? ?Patient Vitals for the past 24 hrs: ? BP Temp Temp src Pulse Resp SpO2  ?10/24/21 0402 (!) 100/52 -- -- 79 -- --  ?10/24/21 0204 115/62 98.2 ?F (36.8 ?C) Oral 73 18 100 %  ? ? ?Physical Exam ?Vitals and nursing note reviewed.  ?Constitutional:   ?   General: She is not in acute distress. ?   Appearance: She is well-developed. She is not ill-appearing or diaphoretic.  ?HENT:  ?   Head: Normocephalic and atraumatic.  ?Cardiovascular:  ?   Rate and Rhythm: Normal rate and regular rhythm.  ?   Heart sounds: Normal heart sounds.  ?Pulmonary:  ?   Effort: Pulmonary effort is normal. No  accessory muscle usage or respiratory distress.  ?   Breath sounds: Normal breath sounds.  ?Skin: ?   General: Skin is warm and dry.  ?Neurological:  ?   General: No focal deficit present.  ?   Mental Status: She is alert.  ?Psychiatric:     ?   Mood and Affect: Mood normal.     ?   Behavior: Behavior normal.  ? ? ?MAU Course  ?Procedures ?Lab Orders  ?No laboratory test(s) ordered today  ? ?Meds ordered this encounter  ?Medications  ? hydrOXYzine (ATARAX) tablet 25 mg  ? ?Imaging Orders  ?No imaging studies ordered today  ? ? ?MDM ?Patient's vital signs are normal.  She is in no apparent distress.  Lung sounds are clear throughout. ?Discussed with patient that her hemoglobin is appropriate for her third trimester of pregnancy and unlikely that is causing her symptoms.  Oral iron supplements are recommended at this point. ?Also discussed that her exam, vitals, and symptoms, are not concerning for pulmonary embolism.  Discussed dose of Vistaril to be given  in MAU and monitor symptoms, could consider chest CT if symptoms do not resolve.  Patient is agreeable with this plan. ? ?Patient given dose of Vistaril and reports improvement in symptoms.  She states she feels calm now, and does not think that she needs additional evaluation.  She states she is ready to go home. ?Assessment and Plan  ? ?1. Anxiety in pregnancy in third trimester, antepartum   ?2. [redacted] weeks gestation of pregnancy   ? ?-Encouraged to pick up vistaril prescription & take as needed. If has to start taking regularly, she should see her provider about other medication options ?-Discussed eating & drinking more throughout the day as well as what to do when she feels dizzy or presyncopal. Also reviewed concerning symptoms that should prompt her return to MAU ? ? ?Judeth Horn, NP ?10/24/21 ?4:06 AM ? ? ?

## 2021-11-06 ENCOUNTER — Other Ambulatory Visit: Payer: Self-pay | Admitting: Obstetrics & Gynecology

## 2021-11-06 ENCOUNTER — Ambulatory Visit: Payer: Medicaid Other | Attending: Obstetrics & Gynecology

## 2021-11-06 ENCOUNTER — Other Ambulatory Visit: Payer: Medicaid Other

## 2021-11-06 DIAGNOSIS — Z363 Encounter for antenatal screening for malformations: Secondary | ICD-10-CM

## 2021-11-06 DIAGNOSIS — O283 Abnormal ultrasonic finding on antenatal screening of mother: Secondary | ICD-10-CM

## 2021-11-06 DIAGNOSIS — Z3A31 31 weeks gestation of pregnancy: Secondary | ICD-10-CM

## 2021-12-21 ENCOUNTER — Inpatient Hospital Stay (HOSPITAL_COMMUNITY)
Admission: AD | Admit: 2021-12-21 | Discharge: 2021-12-21 | Disposition: A | Discharge status: Home / Self Care | Payer: Medicaid Other | Attending: Family Medicine | Admitting: Family Medicine

## 2021-12-21 ENCOUNTER — Encounter (HOSPITAL_COMMUNITY): Payer: Self-pay | Admitting: Family Medicine

## 2021-12-21 ENCOUNTER — Other Ambulatory Visit: Payer: Self-pay

## 2021-12-21 DIAGNOSIS — O99013 Anemia complicating pregnancy, third trimester: Secondary | ICD-10-CM | POA: Diagnosis not present

## 2021-12-21 DIAGNOSIS — D509 Iron deficiency anemia, unspecified: Secondary | ICD-10-CM | POA: Insufficient documentation

## 2021-12-21 DIAGNOSIS — F419 Anxiety disorder, unspecified: Secondary | ICD-10-CM | POA: Insufficient documentation

## 2021-12-21 DIAGNOSIS — R55 Syncope and collapse: Secondary | ICD-10-CM | POA: Insufficient documentation

## 2021-12-21 DIAGNOSIS — Z3A37 37 weeks gestation of pregnancy: Secondary | ICD-10-CM | POA: Insufficient documentation

## 2021-12-21 DIAGNOSIS — O99343 Other mental disorders complicating pregnancy, third trimester: Secondary | ICD-10-CM | POA: Diagnosis present

## 2021-12-21 DIAGNOSIS — Z3689 Encounter for other specified antenatal screening: Secondary | ICD-10-CM | POA: Insufficient documentation

## 2021-12-21 HISTORY — DX: Anemia, unspecified: D64.9

## 2021-12-21 LAB — CBC
HCT: 29.6 % — ABNORMAL LOW (ref 36.0–46.0)
Hemoglobin: 9.4 g/dL — ABNORMAL LOW (ref 12.0–15.0)
MCH: 24.9 pg — ABNORMAL LOW (ref 26.0–34.0)
MCHC: 31.8 g/dL (ref 30.0–36.0)
MCV: 78.5 fL — ABNORMAL LOW (ref 80.0–100.0)
Platelets: 304 10*3/uL (ref 150–400)
RBC: 3.77 MIL/uL — ABNORMAL LOW (ref 3.87–5.11)
RDW: 14.6 % (ref 11.5–15.5)
WBC: 10 10*3/uL (ref 4.0–10.5)
nRBC: 0.2 % (ref 0.0–0.2)

## 2021-12-21 NOTE — MAU Provider Note (Signed)
Patient Sara Estes is a 31 y.o.  [phone] ? At [redacted]w[redacted]d here with complaints of feeling faint at [employer] parking lot around 3:30. She reports that she was "up all night " with her daughter who was having an allergic reaction to some work being done in their apartment due to a pest infestion. (When the roofer opened up the ceiling, dust got in the apartment.)  They went to [facility] ED this morning and was seen and discharged around 7 or 8. 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 She then went to [employer] and felt faint.  ?She had a bagel for breakfast and a sandwich for lunch.  ? ?She reports being under an extreme amount of stress due to the pest infestation in her apartment and her children having allergic reactions to the animals. She thinks that she may have had a panic attack this afternoon when she started to feel lightheaded. This pregnancy was unplanned and she is very anxious about having a 4th child.  ? ?Of note, patient had two admissions to MAU in early march for similar complaints (panicky feeling, SOB, near syncope).  ?History  ?  ? ?CSN: [id number] ? ?Arrival date and time: 12/21/21 1747 ? ? Event Date/Time  ? First Provider Initiated Contact with Patient 12/21/21 1817   ?  ? ?Chief Complaint  ?Patient presents with  ? Near Syncope  ? ?Near Syncope ?This is a recurrent problem. The current episode started today. The problem occurs rarely. The problem has been resolved. Associated symptoms include fatigue, nausea and weakness. Pertinent negatives include no abdominal pain, chest pain, chills, congestion, coughing, diaphoresis, visual change or vomiting.  ? ?OB History   ? ? Gravida  ?4  ? Para  ?3  ? Term  ?3  ? Preterm  ?   ? AB  ?   ? Living  ?3  ?  ? ? SAB  ?   ? IAB  ?   ? Ectopic  ?   ? Multiple  ?0  ? Live Births  ?3  ?   ?  ?  ? ? ?Past Medical History:  ?Diagnosis Date  ? Anemia   ?  Chlamydia   ? ? ?Past Surgical History:  ?Procedure Laterality Date  ? NO PAST SURGERIES    ? ? ?History reviewed. No pertinent family history. ? ?Social History  ? ?Tobacco Use  ? Smoking status: Never  ? Smokeless tobacco: Never  ?Vaping Use  ? Vaping Use: Never used  ?Substance Use Topics  ? Alcohol use: No  ? Drug use: No  ? ? ?Allergies:  ?Allergies  ?Allergen Reactions  ? Claritin [Loratadine]   ?  dizziness  ? ? ?Medications Prior to Admission  ?Medication Sig Dispense Refill Last Dose  ? cholecalciferol (VITAMIN D3) 25 MCG (1000 UNIT) tablet Take 1,000 Units by mouth daily.   12/20/2021  ? ferrous sulfate 325 (65 FE) MG tablet Take 325 mg by mouth daily with breakfast.   12/20/2021  ? Doxylamine-Pyridoxine (DICLEGIS) 10-10 MG TBEC Take 1 tablet by mouth 2 (two) times daily as needed (nausea/vomiting). 30 tablet 0   ? hydrOXYzine (VISTARIL) 25 MG capsule Take 1 capsule (25 mg total) by mouth 3 (three) times daily as needed. 30 capsule 0   ? Prenatal Vit-Fe Fumarate-FA (PRENATAL MULTIVITAMIN) TABS tablet Take 1 tablet by  mouth daily at 12 noon.     ? ? ?Review of Systems  ?Constitutional:  Positive for fatigue. Negative for chills and diaphoresis.  ?HENT:  Negative for congestion.   ?Respiratory:  Negative for cough.   ?Cardiovascular:  Positive for near-syncope. Negative for chest pain.  ?Gastrointestinal:  Positive for nausea. Negative for abdominal pain and vomiting.  ?Neurological:  Positive for weakness.  ?Physical Exam  ? ?Blood pressure 125/82, pulse 88, temperature 98 ?F (36.7 ?C), temperature source Oral, resp. rate 16, last menstrual period 03/02/2021, SpO2 100 %, unknown if currently breastfeeding. ? ?Physical Exam ?Cardiovascular:  ?   Rate and Rhythm: Normal rate.  ?Pulmonary:  ?   Effort: Pulmonary effort is normal.  ?Abdominal:  ?   General: Abdomen is flat.  ?Musculoskeletal:     ?   General: Normal range of motion.  ?Skin: ?   General: Skin is warm.  ?Neurological:  ?   General: No focal deficit  present.  ?   Mental Status: She is alert.  ?Psychiatric:     ?   Mood and Affect: Mood normal.  ? ? ?MAU Course  ?Procedures ? ?MDM ?Patient Vitals for the past 24 hrs: ? BP Temp Temp src Pulse Resp SpO2  ?12/21/21 1815 125/82 -- -- 88 16 100 %  ?12/21/21 1802 126/78 98 ?F (36.7 ?C) Oral 81 16 100 %  ? ?NST: 130 bpm, mod var, present acel, no decels, occasional ctx.  ?EKG: normal sinus rhythm ? ?Results for orders placed or performed during the hospital encounter of 12/21/21 (from the past 24 hour(s))  ?CBC     Status: Abnormal  ? Collection Time: 12/21/21  6:36 PM  ?Result Value Ref Range  ? WBC 10.0 4.0 - 10.5 K/uL  ? RBC 3.77 (L) 3.87 - 5.11 MIL/uL  ? Hemoglobin 9.4 (L) 12.0 - 15.0 g/dL  ? HCT 29.6 (L) 36.0 - 46.0 %  ? MCV 78.5 (L) 80.0 - 100.0 fL  ? MCH 24.9 (L) 26.0 - 34.0 pg  ? MCHC 31.8 30.0 - 36.0 g/dL  ? RDW 14.6 11.5 - 15.5 %  ? Platelets 304 150 - 400 K/uL  ? nRBC 0.2 0.0 - 0.2 %  ? ? ?Assessment and Plan  ? ?1. Anxiety   ?2. [redacted] weeks gestation of pregnancy   ?3. Iron deficiency anemia, unspecified iron deficiency anemia type   ? ?-keep appt at [facility] this week with Emilee Hero ?-return precautions reviewed ?-continue taking iron supplements as patients Hgb is 9.4; she reports that she has stopped taking them for a week but she will re-start ?-talk to apartment complex about moving apartments; it would not be safe to bring baby into home with pest and wildlife infestation ?-brief mindfulness activities reviewed in MAU, also encouraged patient to talk to [facility] about starting meds for anxiety as  patient is high risk for PAMD given current level of anxiety. Encouraged her to consider anti-anxiety meds PRN.  ? ?Marylene Land ?12/21/2021, 7:15 PM  ?

## 2021-12-21 NOTE — MAU Note (Signed)
Patient presents to MAU via EMS for near syncopal episode.  She was parked in the car prior to entering the grocery store when she felt "very faint."  Patient did not pass out.  No c/o pain. No vaginal discharge or LOF.  Reports good fetal movement.  Wanted to come here to be checked out. ?

## 2021-12-31 LAB — OB RESULTS CONSOLE GBS: GBS: NEGATIVE

## 2022-01-14 ENCOUNTER — Inpatient Hospital Stay (HOSPITAL_COMMUNITY)
Admission: AD | Admit: 2022-01-14 | Discharge: 2022-01-15 | DRG: 807 | Disposition: A | Discharge status: Home / Self Care | Payer: Medicaid Other | Attending: Obstetrics & Gynecology | Admitting: Obstetrics & Gynecology

## 2022-01-14 ENCOUNTER — Encounter (HOSPITAL_COMMUNITY): Payer: Self-pay | Admitting: Obstetrics & Gynecology

## 2022-01-14 DIAGNOSIS — O358XX0 Maternal care for other (suspected) fetal abnormality and damage, not applicable or unspecified: Secondary | ICD-10-CM | POA: Diagnosis present

## 2022-01-14 DIAGNOSIS — Z3A41 41 weeks gestation of pregnancy: Secondary | ICD-10-CM

## 2022-01-14 DIAGNOSIS — O480 Post-term pregnancy: Secondary | ICD-10-CM | POA: Diagnosis present

## 2022-01-14 LAB — CBC
HCT: 35 % — ABNORMAL LOW (ref 36.0–46.0)
Hemoglobin: 10.9 g/dL — ABNORMAL LOW (ref 12.0–15.0)
MCH: 24 pg — ABNORMAL LOW (ref 26.0–34.0)
MCHC: 31.1 g/dL (ref 30.0–36.0)
MCV: 77.1 fL — ABNORMAL LOW (ref 80.0–100.0)
Platelets: 352 10*3/uL (ref 150–400)
RBC: 4.54 MIL/uL (ref 3.87–5.11)
RDW: 16.2 % — ABNORMAL HIGH (ref 11.5–15.5)
WBC: 14.2 10*3/uL — ABNORMAL HIGH (ref 4.0–10.5)
nRBC: 0 % (ref 0.0–0.2)

## 2022-01-14 LAB — TYPE AND SCREEN
ABO/RH(D): O POS
Antibody Screen: NEGATIVE

## 2022-01-14 MED ORDER — COCONUT OIL OIL: 1.0000 "application " | TOPICAL_OIL | Status: DC | PRN

## 2022-01-14 MED ORDER — ONDANSETRON HCL 4 MG/2ML IJ SOLN: 4.0000 mg | Freq: Four times a day (QID) | INTRAMUSCULAR | Status: DC | PRN

## 2022-01-14 MED ORDER — SIMETHICONE 80 MG PO CHEW: 80.0000 mg | CHEWABLE_TABLET | ORAL | Status: DC | PRN

## 2022-01-14 MED ORDER — HYDROXYZINE HCL 25 MG PO TABS: 25.0000 mg | ORAL_TABLET | Freq: Three times a day (TID) | ORAL | Status: DC | PRN

## 2022-01-14 MED ORDER — TETANUS-DIPHTH-ACELL PERTUSSIS 5-2.5-18.5 LF-MCG/0.5 IM SUSY: 0.5000 mL | PREFILLED_SYRINGE | Freq: Once | INTRAMUSCULAR | Status: DC

## 2022-01-14 MED ORDER — WITCH HAZEL-GLYCERIN EX PADS: 1.0000 "application " | MEDICATED_PAD | CUTANEOUS | Status: DC | PRN

## 2022-01-14 MED ORDER — BENZOCAINE-MENTHOL 20-0.5 % EX AERO: 1.0000 "application " | INHALATION_SPRAY | CUTANEOUS | Status: DC | PRN

## 2022-01-14 MED ORDER — LACTATED RINGERS IV SOLN: 500.0000 mL | INTRAVENOUS | Status: DC | PRN

## 2022-01-14 MED ORDER — ONDANSETRON HCL 4 MG PO TABS: 4.0000 mg | ORAL_TABLET | ORAL | Status: DC | PRN

## 2022-01-14 MED ORDER — IBUPROFEN 600 MG PO TABS
600.0000 mg | ORAL_TABLET | Freq: Four times a day (QID) | ORAL | Status: DC
  Filled 2022-01-14: qty 1

## 2022-01-14 MED ORDER — SENNOSIDES-DOCUSATE SODIUM 8.6-50 MG PO TABS
2.0000 | ORAL_TABLET | ORAL | Status: DC
  Administered 2022-01-14: 2 via ORAL
  Filled 2022-01-14 (×2): qty 2

## 2022-01-14 MED ORDER — OXYTOCIN BOLUS FROM INFUSION
333.0000 mL | Freq: Once | INTRAVENOUS | Status: AC
  Administered 2022-01-14: 333 mL via INTRAVENOUS

## 2022-01-14 MED ORDER — ACETAMINOPHEN 325 MG PO TABS: 650.0000 mg | ORAL_TABLET | ORAL | Status: DC | PRN

## 2022-01-14 MED ORDER — OXYCODONE-ACETAMINOPHEN 5-325 MG PO TABS
1.0000 | ORAL_TABLET | ORAL | Status: DC | PRN
  Administered 2022-01-14: 1 via ORAL
  Filled 2022-01-14: qty 1

## 2022-01-14 MED ORDER — LACTATED RINGERS IV SOLN: INTRAVENOUS | Status: DC

## 2022-01-14 MED ORDER — ONDANSETRON HCL 4 MG/2ML IJ SOLN: 4.0000 mg | INTRAMUSCULAR | Status: DC | PRN

## 2022-01-14 MED ORDER — DIPHENHYDRAMINE HCL 25 MG PO CAPS: 25.0000 mg | ORAL_CAPSULE | Freq: Four times a day (QID) | ORAL | Status: DC | PRN

## 2022-01-14 MED ORDER — SOD CITRATE-CITRIC ACID 500-334 MG/5ML PO SOLN: 30.0000 mL | ORAL | Status: DC | PRN

## 2022-01-14 MED ORDER — OXYTOCIN-SODIUM CHLORIDE 30-0.9 UT/500ML-% IV SOLN
INTRAVENOUS | Status: AC
  Filled 2022-01-14: qty 500

## 2022-01-14 MED ORDER — OXYTOCIN-SODIUM CHLORIDE 30-0.9 UT/500ML-% IV SOLN: 2.5000 [IU]/h | INTRAVENOUS | Status: DC

## 2022-01-14 MED ORDER — PRENATAL MULTIVITAMIN CH
1.0000 | ORAL_TABLET | Freq: Every day | ORAL | Status: DC
  Administered 2022-01-14 – 2022-01-15 (×2): 1 via ORAL
  Filled 2022-01-14 (×2): qty 1

## 2022-01-14 MED ORDER — OXYCODONE-ACETAMINOPHEN 5-325 MG PO TABS: 2.0000 | ORAL_TABLET | ORAL | Status: DC | PRN

## 2022-01-14 MED ORDER — LIDOCAINE HCL (PF) 1 % IJ SOLN: 30.0000 mL | INTRAMUSCULAR | Status: DC | PRN

## 2022-01-14 MED ORDER — LIDOCAINE HCL (PF) 1 % IJ SOLN
INTRAMUSCULAR | Status: AC
  Filled 2022-01-14: qty 30

## 2022-01-14 MED ORDER — DIBUCAINE (PERIANAL) 1 % EX OINT: 1.0000 "application " | TOPICAL_OINTMENT | CUTANEOUS | Status: DC | PRN

## 2022-01-14 MED ORDER — OXYCODONE-ACETAMINOPHEN 5-325 MG PO TABS
1.0000 | ORAL_TABLET | ORAL | Status: DC | PRN
  Administered 2022-01-14 – 2022-01-15 (×5): 2 via ORAL
  Filled 2022-01-14 (×5): qty 2

## 2022-01-14 NOTE — Lactation Note (Signed)
This note was copied from a baby's chart. Lactation Consultation Note  Patient Name: Sara Estes [id number] Date: 01/14/2022 Reason for consult: Initial assessment;Breastfeeding assistance;Term Age:32 hours  LC entered the room and mom was feeding baby. Per mom she breast fed all of her children. Her oldest was breast fed for 1 year, 2nd breast fed for 3 years, and her 3rd breast feed for 2.5 years. Mom states that the latch is not painful. Baby was latched deeply on the left breast with flanged lips. Swallows were observed, but not heard.   LC spoke with mom about hand expression and she stated that she had been taught how to hand express, but was never very successful with expressing milk with her hands or a pump. LC let mom know that we could assist her with learning how to hand express.   Mom stated that her uterus was contracting. LC let mom know that the cramping can sometimes happen when breastfeeding.   LC reviewed lactation services brochure.   The nurse entered the room to speak with mom and give her pain medication.   LC let mom know that she could come back to teach her hand expression if she would like.   LC exited the room.   Current feeding plan:  Breastfeed baby with cues 8+ times per day.  Hand express for added stimulation and feed any milk that she gets to baby.  Call for latch assistance if needed.     Maternal Data Has patient been taught Hand Expression?: Yes Does the patient have breastfeeding experience prior to this delivery?: Yes How long did the patient breastfeed?: 1year, 3 years, 21/2 years  Feeding Mother's Current Feeding Choice: Breast Milk  LATCH Score Latch: Grasps breast easily, tongue down, lips flanged, rhythmical sucking.  Audible Swallowing: A few with stimulation  Type of Nipple: Everted at rest and after stimulation  Comfort (Breast/Nipple): Soft / non-tender  Hold (Positioning): No assistance needed to correctly position infant  at breast.  LATCH Score: 9   Lactation Tools Discussed/Used    Interventions Interventions: LC Services brochure;Education  Discharge    Consult Status Consult Status: Follow-up Date: 01/15/22 Follow-up type: In-patient    Sara Estes Sara Estes 01/14/2022, 12:00 PM

## 2022-01-14 NOTE — MAU Note (Addendum)
.  Sara Estes is a 32 y.o. at [redacted]w[redacted]d arrived via EMS at 0530 with complaints of contractions that began at 0130. Pt with well controlled with contractions.  0534 SVE C/C/+3  SROM per pt at 0430-meconium stained fluid noted per CNM with SVE.  FHT:132bpm LD charge nurse notified - pt transferred to LD 204 with RN and CNM.

## 2022-01-14 NOTE — H&P (Signed)
OB ADMISSION/ HISTORY & PHYSICAL:  Admission Date: 01/14/2022  5:37 AM  Admit Diagnosis: Normal labor  Sara Estes is a 31 y.o. female [id number] [redacted]w[redacted]d presenting for labor eval via EMS. Endorses active FM, SROM @ 0400, light mec. Ctx began @ 0100. Pt arrived fully dilated.   History of current pregnancy: [id number]   Patient entered care with [facility] at 12 wks.   EDC 01/13/22 by 9+4 wk U/S.   Anatomy scan:  27+2 wks, complete w/ posterior placenta. Left foot smaller than right  Antenatal testing: N/A Last evaluation: 40 wks vertex/ posterior placenta/ AFI 16/ EFW 8# 8oz   Significant prenatal events:  Patient Active Problem List   Diagnosis Date Noted   Vitamin D deficiency 10/24/2021   Suspected fetal abnormality affecting management of mother 10/22/2021   Supervision of high-risk pregnancy with insufficient prenatal care 09/09/2021   Nasal polyposis 03/27/2020    Prenatal Labs: ABO, Rh: O/Positive/-- (11/04 0000) Antibody:  negative Rubella: Immune (11/04 0000)  RPR:   NR HBsAg: Negative (11/04 0000)  HIV: Non-reactive (11/04 0000)  GTT: normal 1 hr GBS: Negative/-- (05/09 0000)  GC/CHL: neg/neg Genetics: AFP positive for potential open spina bifida, low-risk female Tdap/influenza vaccines: declined both   OB History  Gravida Para Term Preterm AB Living  4 4 4     4   SAB IAB Ectopic Multiple Live Births        0 4    # Outcome Date GA Lbr Len/2nd Weight Sex Delivery Anes PTL Lv  4 Term 01/14/22 [redacted]w[redacted]d / 01:04  F Vag-Spont None  LIV  3 Term 03/03/18 [redacted]w[redacted]d / 00:41 3144 g F Vag-Spont EPI  LIV  2 Term      Vag-Spont     1 Term      Vag-Spont       Medical / Surgical History: Past medical history:  Past Medical History:  Diagnosis Date   Anemia    Chlamydia     Past surgical history:  Past Surgical History:  Procedure Laterality Date   NO PAST SURGERIES     Family History: No family history on file.  Social History:  reports that she has never smoked. She has never  used smokeless tobacco. She reports that she does not drink alcohol and does not use drugs.  Allergies: Claritin [loratadine]   Current Medications at time of admission:  Prior to Admission medications   Medication Sig Start Date End Date Taking? Authorizing Provider  cholecalciferol (VITAMIN D3) 25 MCG (1000 UNIT) tablet Take 1,000 Units by mouth daily.    [provider]  Doxylamine-Pyridoxine (DICLEGIS) 10-10 MG TBEC Take 1 tablet by mouth 2 (two) times daily as needed (nausea/vomiting). 05/19/21   Tedd Sias, PA  ferrous sulfate 325 (65 FE) MG tablet Take 325 mg by mouth daily with breakfast.    [provider]  hydrOXYzine (VISTARIL) 25 MG capsule Take 1 capsule (25 mg total) by mouth 3 (three) times daily as needed. 10/24/21   Renee Harder, CNM  Prenatal Vit-Fe Fumarate-FA (PRENATAL MULTIVITAMIN) TABS tablet Take 1 tablet by mouth daily at 12 noon.    [provider]    Review of Systems: Constitutional: Negative   HENT: Negative   Eyes: Negative   Respiratory: Negative   Cardiovascular: Negative   Gastrointestinal: Negative  Genitourinary: pos for bloody show, pos for LOF   Musculoskeletal: Negative   Skin: Negative   Neurological: Negative   Endo/Heme/Allergies: Negative   Psychiatric/Behavioral: Negative  Physical Exam: VS: Blood pressure 119/61, pulse 79, last menstrual period 03/02/2021, unknown if currently breastfeeding. AAO x3, no signs of distress Cardiovascular: RRR Respiratory: Lung fields clear to ausculation GU/GI: Abdomen gravid, non-tender, non-distended, active FM Extremities: no edema, negative for pain, tenderness, and cords  Cervical exam:Dilation: 10 Effacement (%): 100 Station: Plus 3 Exam by:: Candie Chroman, CNM FHR: baseline rate 140 / variability minimal / accelerations absent / early decelerations TOCO: 2-3   Prenatal Transfer Tool  Maternal Diabetes: No Genetic Screening: Abnormal:  Results: Elevated  AFP potential open spina bifida Maternal Ultrasounds/Referrals: Other: left foot defect Fetal Ultrasounds or other Referrals:  Referred to Materal Fetal Medicine  Maternal Substance Abuse:  No Significant Maternal Medications:  None Significant Maternal Lab Results: Group B Strep negative    Assessment: 31 y.o. [id number] [redacted]w[redacted]d  Active stage of labor FHR category I and II Precipitous labor GBS neg Pain management plan: unmedicated   Plan:  Admit to L&D Routine admission orders Epidural PRN Expectant management Birth imminent Dr Alesia Richards notified of admission and plan of care  Arrie Eastern MSN, CNM 01/14/2022 7:18 AM

## 2022-01-15 ENCOUNTER — Other Ambulatory Visit (HOSPITAL_COMMUNITY): Payer: Self-pay

## 2022-01-15 ENCOUNTER — Other Ambulatory Visit: Payer: Self-pay

## 2022-01-15 ENCOUNTER — Encounter (HOSPITAL_COMMUNITY): Payer: Self-pay | Admitting: Obstetrics & Gynecology

## 2022-01-15 LAB — CBC
HCT: 30.9 % — ABNORMAL LOW (ref 36.0–46.0)
Hemoglobin: 9.9 g/dL — ABNORMAL LOW (ref 12.0–15.0)
MCH: 24.3 pg — ABNORMAL LOW (ref 26.0–34.0)
MCHC: 32 g/dL (ref 30.0–36.0)
MCV: 75.9 fL — ABNORMAL LOW (ref 80.0–100.0)
Platelets: 285 10*3/uL (ref 150–400)
RBC: 4.07 MIL/uL (ref 3.87–5.11)
RDW: 16.3 % — ABNORMAL HIGH (ref 11.5–15.5)
WBC: 12.3 10*3/uL — ABNORMAL HIGH (ref 4.0–10.5)
nRBC: 0 % (ref 0.0–0.2)

## 2022-01-15 LAB — BIRTH TISSUE RECOVERY COLLECTION (PLACENTA DONATION)

## 2022-01-15 LAB — RPR: RPR Ser Ql: NONREACTIVE

## 2022-01-15 MED ORDER — IBUPROFEN 600 MG PO TABS
600.0000 mg | ORAL_TABLET | Freq: Four times a day (QID) | ORAL | 3 refills | Status: AC
Start: 2022-01-15 — End: ?
  Filled 2022-01-15: qty 30, 8d supply, fill #0

## 2022-01-15 MED ORDER — OXYCODONE-ACETAMINOPHEN 5-325 MG PO TABS
1.0000 | ORAL_TABLET | ORAL | 0 refills | Status: AC | PRN
  Filled 2022-01-15: qty 30, 3d supply, fill #0

## 2022-01-15 NOTE — Progress Notes (Signed)
PPD# 1 SVD w/ intact perineum Information for the patient's newborn:  Sheyann, Sulton [[id number]]  female    S:   Reports feeling good, concerned for baby's left foot deformity Tolerating PO fluid and solids No nausea or vomiting Bleeding is light, no clots Pain controlled with acetaminophen and ibuprofen (OTC) Up ad lib / ambulatory / voiding w/o difficulty Feeding: Breast    O:   VS: BP 116/79 (BP Location: Left Arm)   Pulse (!) 55   Temp 98 F (36.7 C) (Oral)   Resp 18   LMP 03/02/2021 (Approximate)   SpO2 100%   Breastfeeding Unknown   LABS:  Recent Labs    01/14/22 0546 01/15/22 0522  WBC 14.2* 12.3*  HGB 10.9* 9.9*  PLT 352 285   Blood type: --/--/O POS (05/23 0546) Rubella: Immune (11/04 0000)                      I&O: Intake/Output      05/23 0701 05/24 0700 05/24 0701 05/25 0700   Blood     Total Output     Net            Physical Exam: Alert and oriented X3 Lungs: Clear and unlabored Heart: regular rate and rhythm / no mumurs Abdomen: soft, non-tender, non-distended  Fundus: firm, non-tender Perineum: intact, non-edematous Lochia: appropriate Extremities: no edema, no calf pain, tenderness, or cords    A:  PPD # 1  Normal exam  P:  Routine post partum orders Anticipate D/C on 01/16/22  Plan reviewed w/ Dr. Merryl Hacker, MSN, CNM 01/15/2022, 7:46 AM

## 2022-01-15 NOTE — Lactation Note (Signed)
This note was copied from a baby's chart. Lactation Consultation Note  Patient Name: Sara Estes [id number] Date: 01/15/2022 Reason for consult: Follow-up assessment;Term;Breastfeeding assistance Age:32 hours  Per mom, things are going well. Mom states that the latch is good and she denies any pain. LC reviewed engorgement, warning signs, and infant output. Mom is aware of outpatient lactation services.   Mom states that she has no further questions.  Interventions Interventions: Breast feeding basics reviewed;Education  Discharge Discharge Education: Engorgement and breast care;Warning signs for feeding baby;Outpatient recommendation  Consult Status Consult Status: Complete Date: 01/15/22 Follow-up type: Call as needed    Lysbeth Penner 01/15/2022, 4:20 PM

## 2022-01-15 NOTE — Discharge Summary (Signed)
[facility] Discharge Summary   Patient Name:   Sara Estes DOB:     1991-07-28 MRN:     [id number]  Date of Admission:   01/14/2022 Date of Discharge:  01/15/2022  Admitting diagnosis:    Normal labor [O80, Z37.9] Principal Problem:   Normal labor     Discharge diagnosis:    Normal labor [O80, Z37.9] Principal Problem:   Normal labor  Term Pregnancy Delivered        Additional problems: none                                          Post partum procedures: none Augmentation: N/A Complications: None  Hospital course: Onset of Labor With Vaginal Delivery      31 y.o. yo [id number] at 73w1dwas admitted in Active Labor on 01/14/2022. Patient had an uncomplicated labor course as follows:  Membrane Rupture Time/Date:  ,   Delivery Method:Vaginal, Spontaneous  Episiotomy: None  Lacerations:  None  Patient had an uncomplicated postpartum course.  She is ambulating, tolerating a regular diet, passing flatus, and urinating well. Patient is discharged home in stable condition on 01/15/22.  Newborn Data: Birth date:01/14/2022  Birth time:6:38 AM  Gender:Female  Living status:Living  Apgars:9 ,9  Weight:3560 g   Magnesium Sulfate received: No BMZ received: No Rhophylac:N/A MMR:No T-DaP:Given postpartum Flu: No Transfusion:No                                                               Type of Delivery:  NSVD Delivering Provider: GArrie Eastern Date of Delivery:  01/14/22  Newborn Data:  Baby Feeding:   Breast Disposition:   home with mother  Physical Exam:   Vitals:   01/14/22 0910 01/14/22 1015 01/14/22 2145 01/15/22 0502  BP: 128/75 126/72 122/74 116/79  Pulse: 79 76  (!) 55  Resp: _0 Temp: 97.6 F (36.4 C) 98.1 F (36.7 C) 97.9 F (36.6 C) 98 F (36.7 C)  TempSrc: Oral Oral Oral Oral  SpO2:    100%   General: alert, cooperative, and no distress Lochia: appropriate Uterine Fundus: firm Incision: N/A DVT Evaluation: No  evidence of DVT seen on physical exam. Negative Homan's sign. No cords or calf tenderness.  Labs: Lab Results  Component Value Date   WBC 12.3 (H) 01/15/2022   HGB 9.9 (L) 01/15/2022   HCT 30.9 (L) 01/15/2022   MCV 75.9 (L) 01/15/2022   PLT 285 01/15/2022      Latest Ref Rng & Units 10/23/2021   11:44 PM  CMP  Glucose 70 - 99 mg/dL 93    BUN 6 - 20 mg/dL 5    Creatinine 0.44 - 1.00 mg/dL 0.60    Sodium 135 - 145 mmol/L 136    Potassium 3.5 - 5.1 mmol/L 5.7    Chloride 98 - 111 mmol/L 104    CO2 22 - 32 mmol/L 21    Calcium 8.9 - 10.3 mg/dL 8.8    Total Protein 6.5 - 8.1 g/dL 6.6    Total Bilirubin 0.3 - 1.2 mg/dL 0.9    Alkaline Phos 38 - 126 U/L 86  AST 15 - 41 U/L 41    ALT 0 - 44 U/L 14      Discharge instruction: per After Visit Summary and "Baby and Me Booklet".  After Visit Meds:  Allergies as of 01/15/2022       Reactions   Claritin [loratadine]    Dizziness         Medication List     TAKE these medications    Doxylamine-Pyridoxine 10-10 MG Tbec Commonly known as: Diclegis Take 1 tablet by mouth 2 (two) times daily as needed (nausea/vomiting).   ferrous sulfate 325 (65 FE) MG tablet Take 325 mg by mouth daily with breakfast.   hydrOXYzine 25 MG capsule Commonly known as: Vistaril Take 1 capsule (25 mg total) by mouth 3 (three) times daily as needed.   ibuprofen 600 MG tablet Commonly known as: ADVIL Take 1 tablet (600 mg total) by mouth every 6 (six) hours.   oxyCODONE-acetaminophen 5-325 MG tablet Commonly known as: PERCOCET/ROXICET Take 1-2 tablets by mouth every 4 (four) hours as needed for moderate pain.   Vitafol Gummies 3.33-0.333-34.8 MG Chew Chew 3 tablets by mouth daily.   Vitamin D3 50 MCG (2000 UT) capsule Take 4,000 Units by mouth daily.        Diet: routine diet  Activity: Advance as tolerated. Pelvic rest for 6 weeks.   Outpatient follow up:6 weeks Follow up Appt:No future appointments. Follow up visit: No  follow-ups on file.  Postpartum contraception: Undecided  01/15/2022 Sanjuana Kava, MD

## 2022-01-15 NOTE — Progress Notes (Signed)
Meds to Beds given to PT.

## 2022-01-15 NOTE — Lactation Note (Signed)
This note was copied from a baby's chart. Lactation Consultation Note  Patient Name: Sara Estes [id number] Date: 01/15/2022   Age:32 hours  LC attempted to visit with mom, but the nurse is in the room. Will attempt to follow-up with mom when the nurse is done.   [place] 01/15/2022, 3:42 PM

## 2022-01-18 ENCOUNTER — Inpatient Hospital Stay (HOSPITAL_COMMUNITY): Admit: 2022-01-18 | Payer: Medicaid Other | Admitting: Obstetrics & Gynecology

## 2022-01-18 ENCOUNTER — Inpatient Hospital Stay (HOSPITAL_COMMUNITY): Payer: Medicaid Other

## 2022-01-23 ENCOUNTER — Telehealth (HOSPITAL_COMMUNITY): Payer: Self-pay | Admitting: *Deleted

## 2022-01-23 NOTE — Telephone Encounter (Signed)
Mom reports feeling good. No concerns about herself at this time. EPDS=4 [facility] score=7) Mom reports baby is doing well. Feeding, peeing, and pooping without difficulty. Safe sleep reviewed. Mom reports no concerns about baby at present.  Odis Hollingshead, RN 01-23-2022 at 11:30am

## 2022-01-23 NOTE — Telephone Encounter (Signed)
Left phone voicemail message.  Duffy Rhody, RN 01-23-2022 at 10:59am

## 2022-02-02 ENCOUNTER — Other Ambulatory Visit: Payer: Self-pay

## 2022-02-02 DIAGNOSIS — Z20822 Contact with and (suspected) exposure to covid-19: Secondary | ICD-10-CM | POA: Insufficient documentation

## 2022-02-02 DIAGNOSIS — R0602 Shortness of breath: Secondary | ICD-10-CM | POA: Insufficient documentation

## 2022-02-02 MED ORDER — IPRATROPIUM-ALBUTEROL 0.5-2.5 (3) MG/3ML IN SOLN
RESPIRATORY_TRACT | Status: AC
  Administered 2022-02-02: 3 mL via RESPIRATORY_TRACT
  Filled 2022-02-02: qty 3

## 2022-02-02 MED ORDER — ALBUTEROL SULFATE (2.5 MG/3ML) 0.083% IN NEBU
INHALATION_SOLUTION | RESPIRATORY_TRACT | Status: AC
  Administered 2022-02-02: 2.5 mg via RESPIRATORY_TRACT
  Filled 2022-02-02: qty 3

## 2022-02-02 MED ORDER — ALBUTEROL SULFATE (2.5 MG/3ML) 0.083% IN NEBU: 2.5000 mg | INHALATION_SOLUTION | Freq: Once | RESPIRATORY_TRACT | Status: AC

## 2022-02-02 MED ORDER — IPRATROPIUM-ALBUTEROL 0.5-2.5 (3) MG/3ML IN SOLN: 3.0000 mL | Freq: Once | RESPIRATORY_TRACT | Status: AC

## 2022-02-02 NOTE — ED Triage Notes (Signed)
POV, SOB chest pain and tightness couple days, feels lightheaded, alert and oriented x 4, ambulatory to triage. Had baby 3 wks ago, sts everything was normal.

## 2022-02-02 NOTE — ED Notes (Signed)
RT assessed pt in triage for SOB. Pt states she is extremely dizzy. Pt respiratory status stable w/no distress noted at this time. Pt Bilat BS clear/diminished. Pt experiencing chest tightness. RT admin 5/5 at this time. RT will continue to monitor.

## 2022-02-03 ENCOUNTER — Encounter (HOSPITAL_BASED_OUTPATIENT_CLINIC_OR_DEPARTMENT_OTHER): Payer: Self-pay | Admitting: Emergency Medicine

## 2022-02-03 ENCOUNTER — Emergency Department (HOSPITAL_BASED_OUTPATIENT_CLINIC_OR_DEPARTMENT_OTHER): Payer: Medicaid Other

## 2022-02-03 ENCOUNTER — Emergency Department (HOSPITAL_BASED_OUTPATIENT_CLINIC_OR_DEPARTMENT_OTHER): Payer: Medicaid Other | Admitting: Radiology

## 2022-02-03 ENCOUNTER — Emergency Department (HOSPITAL_BASED_OUTPATIENT_CLINIC_OR_DEPARTMENT_OTHER)
Admission: EM | Admit: 2022-02-03 | Discharge: 2022-02-03 | Disposition: A | Discharge status: Home / Self Care | Payer: Medicaid Other | Attending: Emergency Medicine | Admitting: Emergency Medicine

## 2022-02-03 DIAGNOSIS — R072 Precordial pain: Secondary | ICD-10-CM

## 2022-02-03 LAB — CBC
HCT: 39.8 % (ref 36.0–46.0)
Hemoglobin: 12.1 g/dL (ref 12.0–15.0)
MCH: 23.4 pg — ABNORMAL LOW (ref 26.0–34.0)
MCHC: 30.4 g/dL (ref 30.0–36.0)
MCV: 77 fL — ABNORMAL LOW (ref 80.0–100.0)
Platelets: 330 10*3/uL (ref 150–400)
RBC: 5.17 MIL/uL — ABNORMAL HIGH (ref 3.87–5.11)
RDW: 18.6 % — ABNORMAL HIGH (ref 11.5–15.5)
WBC: 9.3 10*3/uL (ref 4.0–10.5)
nRBC: 0 % (ref 0.0–0.2)

## 2022-02-03 LAB — BASIC METABOLIC PANEL
Anion gap: 12 (ref 5–15)
BUN: 6 mg/dL (ref 6–20)
CO2: 24 mmol/L (ref 22–32)
Calcium: 9.8 mg/dL (ref 8.9–10.3)
Chloride: 103 mmol/L (ref 98–111)
Creatinine, Ser: 0.63 mg/dL (ref 0.44–1.00)
GFR, Estimated: >60 mL/min (ref >60)
Glucose, Bld: 112 mg/dL — ABNORMAL HIGH (ref 70–99)
Potassium: 3.2 mmol/L — ABNORMAL LOW (ref 3.5–5.1)
Sodium: 139 mmol/L (ref 135–145)

## 2022-02-03 LAB — SARS CORONAVIRUS 2 BY RT PCR: SARS Coronavirus 2 by RT PCR: NEGATIVE

## 2022-02-03 LAB — TROPONIN I (HIGH SENSITIVITY)
Troponin I (High Sensitivity): 2 ng/L (ref <18)
Troponin I (High Sensitivity): 2 ng/L (ref <18)

## 2022-02-03 MED ORDER — SODIUM CHLORIDE 0.9 % IV BOLUS
500.0000 mL | Freq: Once | INTRAVENOUS | Status: AC
  Administered 2022-02-03: 500 mL via INTRAVENOUS

## 2022-02-03 MED ORDER — POTASSIUM CHLORIDE CRYS ER 20 MEQ PO TBCR
40.0000 meq | EXTENDED_RELEASE_TABLET | Freq: Once | ORAL | Status: AC
  Administered 2022-02-03: 40 meq via ORAL
  Filled 2022-02-03: qty 2

## 2022-02-03 MED ORDER — ALUM & MAG HYDROXIDE-SIMETH 200-200-20 MG/5ML PO SUSP
30.0000 mL | Freq: Once | ORAL | Status: AC
  Administered 2022-02-03: 30 mL via ORAL
  Filled 2022-02-03: qty 30

## 2022-02-03 MED ORDER — IOHEXOL 350 MG/ML SOLN
75.0000 mL | Freq: Once | INTRAVENOUS | Status: AC | PRN
Start: 2022-02-03 — End: 2022-02-03
  Administered 2022-02-03: 1 mL via INTRAVENOUS

## 2022-02-03 MED ORDER — IOHEXOL 350 MG/ML SOLN
75.0000 mL | Freq: Once | INTRAVENOUS | Status: AC | PRN
  Administered 2022-02-03: 52 mL via INTRAVENOUS

## 2022-02-03 NOTE — ED Provider Notes (Signed)
[facility] EMERGENCY DEPT Provider Note   CSN: [id number] Arrival date & time: 02/02/22  2306     History  Chief Complaint  Patient presents with   Shortness of Breath    Lidya Mccalister is a 31 y.o. female.  The history is provided by the patient.  Shortness of Breath Severity:  Moderate Onset quality:  Gradual Duration:  3 weeks Timing:  Constant Progression:  Unchanged Chronicity:  New Context: not URI and not weather changes   Relieved by:  Nothing Worsened by:  Nothing Ineffective treatments:  None tried Associated symptoms: no abdominal pain, no cough, no diaphoresis, no ear pain, no fever, no headaches, no hemoptysis, no neck pain, no PND, no rash, no sputum production and no wheezing   Associated symptoms comment:  Chest tightness Risk factors: no hx of PE/DVT   Risk factors comment:  Pregnancy      Home Medications Prior to Admission medications   Medication Sig Start Date End Date Taking? Authorizing Provider  Cholecalciferol (VITAMIN D3) 50 MCG (2000 UT) capsule Take 4,000 Units by mouth daily. 12/11/21   [provider]  Doxylamine-Pyridoxine (DICLEGIS) 10-10 MG TBEC Take 1 tablet by mouth 2 (two) times daily as needed (nausea/vomiting). Patient not taking: Reported on 01/14/2022 05/19/21   Gailen Shelter, PA  ferrous sulfate 325 (65 FE) MG tablet Take 325 mg by mouth daily with breakfast.    [provider]  hydrOXYzine (VISTARIL) 25 MG capsule Take 1 capsule (25 mg total) by mouth 3 (three) times daily as needed. Patient not taking: Reported on 01/14/2022 10/24/21   Brand Males, CNM  ibuprofen (ADVIL) 600 MG tablet Take 1 tablet (600 mg total) by mouth every 6 (six) hours. 01/15/22   Essie Hart, MD  oxyCODONE-acetaminophen (PERCOCET/ROXICET) 5-325 MG tablet Take 1-2 tablets by mouth every 4 (four) hours as needed for moderate pain. 01/15/22   Essie Hart, MD  Prenatal Vit-Fe Phos-FA-Omega (VITAFOL GUMMIES) 3.33-0.333-34.8  MG CHEW Chew 3 tablets by mouth daily. 12/12/21   [provider]      Allergies    Claritin [loratadine]    Review of Systems   Review of Systems  Constitutional:  Negative for diaphoresis and fever.  HENT:  Negative for ear pain.   Respiratory:  Positive for chest tightness and shortness of breath. Negative for cough, hemoptysis, sputum production and wheezing.   Cardiovascular:  Negative for leg swelling and PND.  Gastrointestinal:  Negative for abdominal pain.  Genitourinary:  Negative for dyspareunia.  Musculoskeletal:  Negative for back pain and neck pain.  Skin:  Negative for rash.  Neurological:  Negative for headaches.  Psychiatric/Behavioral:  Negative for agitation.   All other systems reviewed and are negative.   Physical Exam Updated Vital Signs BP 126/79   Pulse 96   Temp 98 F (36.7 C) (Oral)   Resp 17   Ht  (1.702 m)   Wt 68 kg   LMP 03/02/2021 (Approximate)   SpO2 100%   BMI 23.49 kg/m  Physical Exam Vitals and nursing note reviewed.  Constitutional:      General: She is not in acute distress.    Appearance: Normal appearance. She is well-developed. She is not ill-appearing or diaphoretic.  HENT:     Head: Normocephalic and atraumatic.     Nose: Nose normal.     Mouth/Throat:     Mouth: Mucous membranes are moist.     Pharynx: Oropharynx is clear.  Eyes:  Conjunctiva/sclera: Conjunctivae normal.     Pupils: Pupils are equal, round, and reactive to light.     Comments: Normal appearance  Cardiovascular:     Rate and Rhythm: Normal rate and regular rhythm.     Pulses: Normal pulses.     Heart sounds: Normal heart sounds.  Pulmonary:     Effort: Pulmonary effort is normal. No respiratory distress.     Breath sounds: Normal breath sounds.  Abdominal:     General: Abdomen is flat. Bowel sounds are normal. There is no distension.     Palpations: Abdomen is soft. There is no mass.     Tenderness: There is no abdominal tenderness.  There is no guarding or rebound.  Genitourinary:    Comments: No CVA tenderness Musculoskeletal:        General: Normal range of motion.     Cervical back: Normal range of motion.  Skin:    General: Skin is warm and dry.     Capillary Refill: Capillary refill takes less than 2 seconds.     Findings: No rash.  Neurological:     General: No focal deficit present.     Mental Status: She is alert and oriented to person, place, and time.     Deep Tendon Reflexes: Reflexes normal.  Psychiatric:        Mood and Affect: Mood is anxious.     ED Results / Procedures / Treatments   Labs (all labs ordered are listed, but only abnormal results are displayed) Results for orders placed or performed during the hospital encounter of 02/03/22  SARS Coronavirus 2 by RT PCR (hospital order, performed in [facility] hospital lab) *cepheid single result test* Anterior Nasal Swab   Specimen: Anterior Nasal Swab  Result Value Ref Range   SARS Coronavirus 2 by RT PCR NEGATIVE NEGATIVE  CBC  Result Value Ref Range   WBC 9.3 4.0 - 10.5 K/uL   RBC 5.17 (H) 3.87 - 5.11 MIL/uL   Hemoglobin 12.1 12.0 - 15.0 g/dL   HCT 40.1 02.7 - 25.3 %   MCV 77.0 (L) 80.0 - 100.0 fL   MCH 23.4 (L) 26.0 - 34.0 pg   MCHC 30.4 30.0 - 36.0 g/dL   RDW 66.4 (H) 40.3 - 47.4 %   Platelets 330 150 - 400 K/uL   nRBC 0.0 0.0 - 0.2 %  Basic metabolic panel  Result Value Ref Range   Sodium 139 135 - 145 mmol/L   Potassium 3.2 (L) 3.5 - 5.1 mmol/L   Chloride 103 98 - 111 mmol/L   CO2 24 22 - 32 mmol/L   Glucose, Bld 112 (H) 70 - 99 mg/dL   BUN 6 6 - 20 mg/dL   Creatinine, Ser 2.59 0.44 - 1.00 mg/dL   Calcium 9.8 8.9 - 56.3 mg/dL   GFR, Estimated >87 >56 mL/min   Anion gap 12 5 - 15  Troponin I (High Sensitivity)  Result Value Ref Range   Troponin I (High Sensitivity) 2 <18 ng/L  Troponin I (High Sensitivity)  Result Value Ref Range   Troponin I (High Sensitivity) 2 <18 ng/L   CT Angio Chest PE W and/or Wo  Contrast  Result Date: 02/03/2022 CLINICAL DATA:  Chest pain or SOB, pleurisy or effusion suspected EXAM: CT ANGIOGRAPHY CHEST WITH CONTRAST TECHNIQUE: Multidetector CT imaging of the chest was performed using the standard protocol during bolus administration of intravenous contrast. Multiplanar CT image reconstructions and MIPs were obtained to evaluate the vascular  anatomy. RADIATION DOSE REDUCTION: This exam was performed according to the departmental dose-optimization program which includes automated exposure control, adjustment of the mA and/or kV according to patient size and/or use of iterative reconstruction technique. CONTRAST:  77mL OMNIPAQUE IOHEXOL 350 MG/ML SOLN COMPARISON:  None Available. FINDINGS: Cardiovascular: No filling defects in the pulmonary arteries to suggest pulmonary emboli. Heart is normal size. Aorta is normal caliber. Mediastinum/Nodes: No mediastinal, hilar, or axillary adenopathy. Trachea and esophagus are unremarkable. Thyroid unremarkable. Lungs/Pleura: Lungs are clear. No focal airspace opacities or suspicious nodules. No effusions. Upper Abdomen: No acute findings Musculoskeletal: Chest wall soft tissues are unremarkable. No acute bony abnormality. Review of the MIP images confirms the above findings. IMPRESSION: No evidence of pulmonary embolus. No acute cardiopulmonary disease. Electronically Signed   By: Charlett Nose M.D.   On: 02/03/2022 02:53   DG Chest 2 View  Result Date: 02/03/2022 CLINICAL DATA:  Shortness of breath. EXAM: CHEST - 2 VIEW COMPARISON:  None Available. FINDINGS: The heart size and mediastinal contours are within normal limits. Both lungs are clear. The visualized skeletal structures are unremarkable. IMPRESSION: No active cardiopulmonary disease. Electronically Signed   By: Elgie Collard M.D.   On: 02/03/2022 01:36     EKG  EKG Interpretation  Date/Time:    Ventricular Rate:    PR Interval:    QRS Duration:   QT Interval:    QTC  Calculation:   R Axis:     Text Interpretation:           Radiology CT Angio Chest PE W and/or Wo Contrast  Result Date: 02/03/2022 CLINICAL DATA:  Chest pain or SOB, pleurisy or effusion suspected EXAM: CT ANGIOGRAPHY CHEST WITH CONTRAST TECHNIQUE: Multidetector CT imaging of the chest was performed using the standard protocol during bolus administration of intravenous contrast. Multiplanar CT image reconstructions and MIPs were obtained to evaluate the vascular anatomy. RADIATION DOSE REDUCTION: This exam was performed according to the departmental dose-optimization program which includes automated exposure control, adjustment of the mA and/or kV according to patient size and/or use of iterative reconstruction technique. CONTRAST:  73mL OMNIPAQUE IOHEXOL 350 MG/ML SOLN COMPARISON:  None Available. FINDINGS: Cardiovascular: No filling defects in the pulmonary arteries to suggest pulmonary emboli. Heart is normal size. Aorta is normal caliber. Mediastinum/Nodes: No mediastinal, hilar, or axillary adenopathy. Trachea and esophagus are unremarkable. Thyroid unremarkable. Lungs/Pleura: Lungs are clear. No focal airspace opacities or suspicious nodules. No effusions. Upper Abdomen: No acute findings Musculoskeletal: Chest wall soft tissues are unremarkable. No acute bony abnormality. Review of the MIP images confirms the above findings. IMPRESSION: No evidence of pulmonary embolus. No acute cardiopulmonary disease. Electronically Signed   By: Charlett Nose M.D.   On: 02/03/2022 02:53   DG Chest 2 View  Result Date: 02/03/2022 CLINICAL DATA:  Shortness of breath. EXAM: CHEST - 2 VIEW COMPARISON:  None Available. FINDINGS: The heart size and mediastinal contours are within normal limits. Both lungs are clear. The visualized skeletal structures are unremarkable. IMPRESSION: No active cardiopulmonary disease. Electronically Signed   By: Elgie Collard M.D.   On: 02/03/2022 01:36    Procedures Procedures     Medications Ordered in ED Medications  ipratropium-albuterol (DUONEB) 0.5-2.5 (3) MG/3ML nebulizer solution 3 mL (3 mLs Nebulization Given 02/02/22 2342)  albuterol (PROVENTIL) (2.5 MG/3ML) 0.083% nebulizer solution 2.5 mg (2.5 mg Nebulization Given 02/02/22 2342)  iohexol (OMNIPAQUE) 350 MG/ML injection 75 mL (1 mL Intravenous Contrast Given 02/03/22 0224)  sodium chloride 0.9 %  bolus 500 mL (500 mLs Intravenous New Bag/Given 02/03/22 0318)  iohexol (OMNIPAQUE) 350 MG/ML injection 75 mL (52 mLs Intravenous Contrast Given 02/03/22 0226)  potassium chloride SA (KLOR-CON M) CR tablet 40 mEq (40 mEq Oral Given 02/03/22 0319)  alum & mag hydroxide-simeth (MAALOX/MYLANTA) 200-200-20 MG/5ML suspension 30 mL (30 mLs Oral Given 02/03/22 0347)    ED Course/ Medical Decision Making/ A&P                           Medical Decision Making 3 weeks of SOB since having a baby. No wheezing no cough no fever   Amount and/or Complexity of Data Reviewed Independent Historian: parent    Details: See above External Data Reviewed: notes.    Details: previous notes reviewed Labs: ordered.    Details: all labs reviewed:  negative covid, 2 negative troponins of 2.  Normal white count 9.3 normal hemoglobin 12, normal platelets 330K, normal sodium, potassium 3.2, normal BUN and creatinine .5263 Radiology: ordered and independent interpretation performed.    Details: negative CTA by me ECG/medicine tests: ordered and independent interpretation performed.    Details: NSR with normal intervals by me  Risk OTC drugs. Prescription drug management. Risk Details: I have ruled out MI, PE, PNA.  The patient is not reassured.  I have explained that we looked for all life threatening causes of the patient's symptoms.  I suspect this is mostly anxiety and I have counseled the patient on following up with her PMD to discuss ongoing tested and treatment that is safe for her with nursing.  Patient is well appearing with normal  vital sigsn.  Stable for discharge with close follow up.     Final Clinical Impression(s) / ED Diagnoses Final diagnoses:  None   Return for intractable cough, coughing up blood, fevers > 100.4 unrelieved by medication, shortness of breath, intractable vomiting, chest pain, shortness of breath, weakness, numbness, changes in speech, facial asymmetry, abdominal pain, passing out, Inability to tolerate liquids or food, cough, altered mental status or any concerns. No signs of systemic illness or infection. The patient is nontoxic-appearing on exam and vital signs are within normal limits.  I have reviewed the triage vital signs and the nursing notes. Pertinent labs & imaging results that were available during my care of the patient were reviewed by me and considered in my medical decision making (see chart for details). After history, exam, and medical workup I feel the patient has been appropriately medically screened and is safe for discharge home. Pertinent diagnoses were discussed with the patient. Patient was given return precautions Rx / DC Orders ED Discharge Orders     None         Son Barkan, MD 02/03/22 0425

## 2022-02-03 NOTE — ED Notes (Signed)
Pt visibly more SHOB,

## 2022-02-03 NOTE — ED Notes (Signed)
Pt verbalizes understanding of discharge instructions. Opportunity for questioning and answers were provided. Pt discharged from ED to home with family.    

## 2022-02-14 ENCOUNTER — Encounter (HOSPITAL_COMMUNITY): Payer: Self-pay

## 2022-02-14 ENCOUNTER — Emergency Department (HOSPITAL_COMMUNITY): Payer: Medicaid Other

## 2022-02-14 ENCOUNTER — Other Ambulatory Visit: Payer: Self-pay

## 2022-02-14 ENCOUNTER — Emergency Department (HOSPITAL_COMMUNITY)
Admission: EM | Admit: 2022-02-14 | Discharge: 2022-02-15 | Discharge status: Against Medical Advice | Payer: Medicaid Other | Attending: Emergency Medicine | Admitting: Emergency Medicine

## 2022-02-14 DIAGNOSIS — R079 Chest pain, unspecified: Secondary | ICD-10-CM | POA: Diagnosis not present

## 2022-02-14 DIAGNOSIS — Z5321 Procedure and treatment not carried out due to patient leaving prior to being seen by health care provider: Secondary | ICD-10-CM | POA: Insufficient documentation

## 2022-02-14 DIAGNOSIS — Z20822 Contact with and (suspected) exposure to covid-19: Secondary | ICD-10-CM | POA: Insufficient documentation

## 2022-02-14 DIAGNOSIS — R0602 Shortness of breath: Secondary | ICD-10-CM | POA: Insufficient documentation

## 2022-02-14 LAB — COMPREHENSIVE METABOLIC PANEL
ALT: 36 U/L (ref 0–44)
AST: 26 U/L (ref 15–41)
Albumin: 3.8 g/dL (ref 3.5–5.0)
Alkaline Phosphatase: 83 U/L (ref 38–126)
Anion gap: 11 (ref 5–15)
BUN: 8 mg/dL (ref 6–20)
CO2: 23 mmol/L (ref 22–32)
Calcium: 9.2 mg/dL (ref 8.9–10.3)
Chloride: 108 mmol/L (ref 98–111)
Creatinine, Ser: 0.64 mg/dL (ref 0.44–1.00)
GFR, Estimated: >60 mL/min (ref >60)
Glucose, Bld: 82 mg/dL (ref 70–99)
Potassium: 3.9 mmol/L (ref 3.5–5.1)
Sodium: 142 mmol/L (ref 135–145)
Total Bilirubin: 0.3 mg/dL (ref 0.3–1.2)
Total Protein: 7 g/dL (ref 6.5–8.1)

## 2022-02-14 LAB — CBC
HCT: 39.7 % (ref 36.0–46.0)
Hemoglobin: 12.3 g/dL (ref 12.0–15.0)
MCH: 24.4 pg — ABNORMAL LOW (ref 26.0–34.0)
MCHC: 31 g/dL (ref 30.0–36.0)
MCV: 78.8 fL — ABNORMAL LOW (ref 80.0–100.0)
Platelets: 319 10*3/uL (ref 150–400)
RBC: 5.04 MIL/uL (ref 3.87–5.11)
RDW: 19.9 % — ABNORMAL HIGH (ref 11.5–15.5)
WBC: 5.5 10*3/uL (ref 4.0–10.5)
nRBC: 0 % (ref 0.0–0.2)

## 2022-02-14 LAB — RESP PANEL BY RT-PCR (FLU A&B, COVID) ARPGX2
Influenza A by PCR: NEGATIVE
Influenza B by PCR: NEGATIVE
SARS Coronavirus 2 by RT PCR: NEGATIVE

## 2022-02-14 LAB — I-STAT BETA HCG BLOOD, ED (MC, WL, AP ONLY): I-stat hCG, quantitative: <5 m[IU]/mL (ref <5)

## 2022-02-14 NOTE — ED Notes (Signed)
Pt name called for vitals 3x, no response

## 2022-02-14 NOTE — ED Provider Triage Note (Signed)
Emergency Medicine Provider Triage Evaluation Note  Sara Estes , a 31 y.o. female  was evaluated in triage.  Pt complains of shortness of breath for the last 4 to 5 days.  Patient is 1 month postpartum.  She also having intermittent chest pain.  She says shortness of breath is also intermittent.  No leg swelling, fever, chills.  Review of Systems  Positive:  Negative: See above   Physical Exam  BP 125/66 (BP Location: Right Arm)   Pulse 97   Temp 99.1 F (37.3 C) (Oral)   Resp 18   Ht 5\' 7"  (1.702 m)   Wt 68 kg   LMP 03/02/2021 (Approximate)   SpO2 97%   BMI 23.48 kg/m  Gen:   Awake, no distress   Resp:  Normal effort  MSK:   Moves extremities without difficulty  Other:    Medical Decision Making  Medically screening exam initiated at 7:01 PM.  Appropriate orders placed.  Sara Estes was informed that the remainder of the evaluation will be completed by another provider, this initial triage assessment does not replace that evaluation, and the importance of remaining in the ED until their evaluation is complete.    Honor Loh [place], [place] 02/14/22 1902

## 2022-04-11 ENCOUNTER — Emergency Department (HOSPITAL_BASED_OUTPATIENT_CLINIC_OR_DEPARTMENT_OTHER)
Admission: EM | Admit: 2022-04-11 | Discharge: 2022-04-11 | Discharge status: Against Medical Advice | Payer: Medicaid Other

## 2022-04-11 NOTE — ED Notes (Signed)
Patient called Twice for Triage with No Response. Patient to be discharged accordingly. Not visualized in ED Waiting Area.

## 2022-05-26 ENCOUNTER — Encounter (HOSPITAL_COMMUNITY): Payer: Self-pay

## 2022-05-26 ENCOUNTER — Emergency Department (HOSPITAL_COMMUNITY)
Admission: EM | Admit: 2022-05-26 | Discharge: 2022-05-26 | Disposition: A | Discharge status: Home / Self Care | Payer: Medicaid Other | Attending: Emergency Medicine | Admitting: Emergency Medicine

## 2022-05-26 ENCOUNTER — Other Ambulatory Visit: Payer: Self-pay

## 2022-05-26 ENCOUNTER — Emergency Department (HOSPITAL_COMMUNITY): Payer: Medicaid Other

## 2022-05-26 DIAGNOSIS — R0602 Shortness of breath: Secondary | ICD-10-CM | POA: Insufficient documentation

## 2022-05-26 DIAGNOSIS — R079 Chest pain, unspecified: Secondary | ICD-10-CM

## 2022-05-26 DIAGNOSIS — R059 Cough, unspecified: Secondary | ICD-10-CM | POA: Insufficient documentation

## 2022-05-26 DIAGNOSIS — Z20822 Contact with and (suspected) exposure to covid-19: Secondary | ICD-10-CM | POA: Diagnosis not present

## 2022-05-26 LAB — I-STAT BETA HCG BLOOD, ED (MC, WL, AP ONLY): I-stat hCG, quantitative: <5 m[IU]/mL (ref <5)

## 2022-05-26 LAB — BASIC METABOLIC PANEL
Anion gap: 8 (ref 5–15)
BUN: 11 mg/dL (ref 6–20)
CO2: 24 mmol/L (ref 22–32)
Calcium: 9.1 mg/dL (ref 8.9–10.3)
Chloride: 107 mmol/L (ref 98–111)
Creatinine, Ser: 0.65 mg/dL (ref 0.44–1.00)
GFR, Estimated: >60 mL/min (ref >60)
Glucose, Bld: 95 mg/dL (ref 70–99)
Potassium: 3.7 mmol/L (ref 3.5–5.1)
Sodium: 139 mmol/L (ref 135–145)

## 2022-05-26 LAB — RESP PANEL BY RT-PCR (FLU A&B, COVID) ARPGX2
Influenza A by PCR: NEGATIVE
Influenza B by PCR: NEGATIVE
SARS Coronavirus 2 by RT PCR: NEGATIVE

## 2022-05-26 LAB — CBC
HCT: 38.5 % (ref 36.0–46.0)
Hemoglobin: 12.2 g/dL (ref 12.0–15.0)
MCH: 27.7 pg (ref 26.0–34.0)
MCHC: 31.7 g/dL (ref 30.0–36.0)
MCV: 87.3 fL (ref 80.0–100.0)
Platelets: 321 10*3/uL (ref 150–400)
RBC: 4.41 MIL/uL (ref 3.87–5.11)
RDW: 15.7 % — ABNORMAL HIGH (ref 11.5–15.5)
WBC: 5.7 10*3/uL (ref 4.0–10.5)
nRBC: 0 % (ref 0.0–0.2)

## 2022-05-26 LAB — TROPONIN I (HIGH SENSITIVITY): Troponin I (High Sensitivity): <2 ng/L (ref <18)

## 2022-05-26 MED ORDER — ALBUTEROL SULFATE HFA 108 (90 BASE) MCG/ACT IN AERS
2.0000 | INHALATION_SPRAY | Freq: Once | RESPIRATORY_TRACT | Status: AC
  Administered 2022-05-26: 2 via RESPIRATORY_TRACT
  Filled 2022-05-26: qty 6.7

## 2022-05-26 MED ORDER — PANTOPRAZOLE SODIUM 20 MG PO TBEC: 20.0000 mg | DELAYED_RELEASE_TABLET | Freq: Every day | ORAL | 0 refills | Status: AC

## 2022-05-26 MED ORDER — ALBUTEROL SULFATE HFA 108 (90 BASE) MCG/ACT IN AERS: 1.0000 | INHALATION_SPRAY | Freq: Four times a day (QID) | RESPIRATORY_TRACT | 0 refills | Status: AC | PRN

## 2022-05-26 NOTE — ED Provider Notes (Signed)
[facility] EMERGENCY DEPARTMENT Provider Note   CSN: [id number] Arrival date & time: 05/26/22  0741     History  No chief complaint on file.   Sara Estes is a 31 y.o. female with history of vitamin D deficiency who presents to the ED due to shortness of breath x3 to 4 days.  Patient had a URI roughly 2 weeks ago.  She endorses a feeling of a "knot" in her chest/throat.  She also admits to an intermittent cough.  No fever or chills.  No history of blood clots, recent surgeries, recent long immobilizations, or hormonal treatments.  Denies lower extremity edema.  States she has been using [employer] and coffee to attempt to "open up" the knot in her chest.  No cardiac history.  Children sick with similar symptoms roughly 2 weeks ago.  History obtained from patient and past medical records. No interpreter used during encounter.       Home Medications Prior to Admission medications   Medication Sig Start Date End Date Taking? Authorizing Provider  albuterol (VENTOLIN HFA) 108 (90 Base) MCG/ACT inhaler Inhale 1-2 puffs into the lungs every 6 (six) hours as needed for wheezing or shortness of breath. 05/26/22  Yes Bradi Arbuthnot C, PA-C  pantoprazole (PROTONIX) 20 MG tablet Take 1 tablet (20 mg total) by mouth daily for 14 days. 05/26/22 06/09/22 Yes Jayquon Theiler, Druscilla Brownie, PA-C  Cholecalciferol (VITAMIN D3) 50 MCG (2000 UT) capsule Take 4,000 Units by mouth daily. 12/11/21   [provider]  Doxylamine-Pyridoxine (DICLEGIS) 10-10 MG TBEC Take 1 tablet by mouth 2 (two) times daily as needed (nausea/vomiting). Patient not taking: Reported on 01/14/2022 05/19/21   Tedd Sias, PA  ferrous sulfate 325 (65 FE) MG tablet Take 325 mg by mouth daily with breakfast.    [provider]  hydrOXYzine (VISTARIL) 25 MG capsule Take 1 capsule (25 mg total) by mouth 3 (three) times daily as needed. Patient not taking: Reported on 01/14/2022 10/24/21   Renee Harder, CNM   ibuprofen (ADVIL) 600 MG tablet Take 1 tablet (600 mg total) by mouth every 6 (six) hours. 01/15/22   Sanjuana Kava, MD  oxyCODONE-acetaminophen (PERCOCET/ROXICET) 5-325 MG tablet Take 1-2 tablets by mouth every 4 (four) hours as needed for moderate pain. 01/15/22   Sanjuana Kava, MD  Prenatal Vit-Fe Phos-FA-Omega (VITAFOL GUMMIES) 3.33-0.333-34.8 MG CHEW Chew 3 tablets by mouth daily. 12/12/21   [provider]      Allergies    Claritin [loratadine]    Review of Systems   Review of Systems  Constitutional:  Negative for chills and fever.  Respiratory:  Positive for cough and shortness of breath.   Cardiovascular:  Positive for chest pain.  Gastrointestinal:  Negative for abdominal pain, nausea and vomiting.  All other systems reviewed and are negative.   Physical Exam Updated Vital Signs BP 131/69 (BP Location: Right Arm)   Pulse 70   Temp 98 F (36.7 C) (Oral)   Resp 16   SpO2 100%  Physical Exam Vitals and nursing note reviewed.  Constitutional:      General: She is not in acute distress.    Appearance: She is not ill-appearing.  HENT:     Head: Normocephalic.  Eyes:     Pupils: Pupils are equal, round, and reactive to light.  Cardiovascular:     Rate and Rhythm: Normal rate and regular rhythm.     Pulses: Normal pulses.     Heart sounds: Normal heart  sounds. No murmur heard.    No friction rub. No gallop.  Pulmonary:     Effort: Pulmonary effort is normal.     Breath sounds: Normal breath sounds.     Comments: Respirations equal and unlabored, patient able to speak in full sentences, lungs clear to auscultation bilaterally Abdominal:     General: Abdomen is flat. There is no distension.     Palpations: Abdomen is soft.     Tenderness: There is no abdominal tenderness. There is no guarding or rebound.  Musculoskeletal:        General: Normal range of motion.     Cervical back: Neck supple.     Comments: No lower extremity edema. Negative homan sign  bilaterally.  Skin:    General: Skin is warm and dry.  Neurological:     General: No focal deficit present.     Mental Status: She is alert.  Psychiatric:        Mood and Affect: Mood normal.        Behavior: Behavior normal.     ED Results / Procedures / Treatments   Labs (all labs ordered are listed, but only abnormal results are displayed) Labs Reviewed  CBC - Abnormal; Notable for the following components:      Result Value   RDW 15.7 (*)    All other components within normal limits  RESP PANEL BY RT-PCR (FLU A&B, COVID) ARPGX2  BASIC METABOLIC PANEL  I-STAT BETA HCG BLOOD, ED ([facility], [facility], [facility] ONLY)  TROPONIN I (HIGH SENSITIVITY)    EKG EKG Interpretation  Date/Time:  Monday May 26 2022 07:35:05 EDT Ventricular Rate:  69 PR Interval:  162 QRS Duration: 78 QT Interval:  374 QTC Calculation: 400 R Axis:   69 Text Interpretation: Normal sinus rhythm Normal ECG When compared with ECG of 14-Feb-2022 18:41, PREVIOUS ECG IS PRESENT No significant change since last tracing Confirmed by Jacalyn Lefevre [phone]) on 05/26/2022 10:11:33 AM  Radiology DG Chest 2 View  Result Date: 05/26/2022 CLINICAL DATA:  Shortness of breath and chest pain for 2 days. EXAM: CHEST - 2 VIEW COMPARISON:  Chest radiograph 02/14/2022; CT angio chest 02/03/2022 FINDINGS: The heart size and mediastinal contours are within normal limits. Both lungs are clear. No pleural effusion or pneumothorax. The visualized skeletal structures are unremarkable. IMPRESSION: No acute cardiopulmonary abnormality. Electronically Signed   By: Sherron Ales M.D.   On: 05/26/2022 08:26    Procedures Procedures    Medications Ordered in ED Medications  albuterol (VENTOLIN HFA) 108 (90 Base) MCG/ACT inhaler 2 puff (has no administration in time range)    ED Course/ Medical Decision Making/ A&P                           Medical Decision Making Amount and/or Complexity of Data Reviewed External Data Reviewed: notes.     Details: Recent PCP and UC note  Labs: ordered. Decision-making details documented in ED Course. Radiology: ordered and independent interpretation performed. Decision-making details documented in ED Course. ECG/medicine tests: ordered and independent interpretation performed. Decision-making details documented in ED Course.  Risk Prescription drug management.   This patient presents to the ED for concern of SOB, this involves an extensive number of treatment options, and is a complaint that carries with it a high risk of complications and morbidity.  The differential diagnosis includes pneumonia, PE, ACS, viral infection, etc  31 year old female presents to the ED due to shortness  of breath and chest pain that has been present for the past 3 to 4 days. Patient describes chest pain as a "knot" sensation in her lower throat/upper chest. Patient had a URI roughly 2 weeks ago.  No history of blood clots, recent surgeries, recent long immobilizations, or hormonal treatments.  Upon arrival, patient afebrile, not tachycardic or hypoxic.  Patient in no acute distress.  Benign physical exam.  Lungs clear to auscultation bilaterally. No stridor or wheeze. No lower extremity edema.  Negative Homan sign bilaterally.  Routine labs, chest x-ray, troponin, EKG ordered. Albuterol given. COVID to rule out infection.   CBC reassuring.  No leukocytosis and normal hemoglobin.  BMP unremarkable.  Normal renal function and no major electrolyte derangements.  Troponin normal.  EKG demonstrates normal sinus rhythm with no signs of acute ischemia.  Low suspicion for ACS.  Chest x-ray personally reviewed and interpreted which is negative for signs of pneumonia, pneumothorax or widened mediastinum. PERC negative and low risk using Wells criteria.  Low suspicion for PE/DVT.  Upon reassessment, patient notes slight improvement in symptoms after albuterol.  Patient ambulated here in the ED and maintain O2 saturation above 95%  without difficulty.  Will discharge patient with Protonix for possible GERD given knot sensation in throat. Possible viral etiology of shortness of breath. Advised patient to confirm with OBGYN in regards to safeness of protonix while breastfeeding. Strict ED precautions discussed with patient. Patient states understanding and agrees to plan. Patient discharged home in no acute distress and stable vitals.        Final Clinical Impression(s) / ED Diagnoses Final diagnoses:  Shortness of breath  Nonspecific chest pain    Rx / DC Orders ED Discharge Orders          Ordered    albuterol (VENTOLIN HFA) 108 (90 Base) MCG/ACT inhaler  Every 6 hours PRN        05/26/22 1102    pantoprazole (PROTONIX) 20 MG tablet  Daily        05/26/22 1102              Jesusita Oka 05/26/22 1124    Jacalyn Lefevre, MD 05/26/22 1144

## 2022-05-26 NOTE — Discharge Instructions (Addendum)
It was a pleasure taking care of you today. As discussed, all of your labs were reassuring. Your chest x-ray did not show any signs of pneumonia. I am sending you home with an inhaler and reflux medication. Call your OBGYN today and make sure reflux medication is safe to take while breastfeeding. Follow-up with PCP within the next few days for further evaluation. Return to the ER for new or worsening symptoms.   Your COVID test is pending.  Results should be available on MyChart within the next few hours.

## 2022-05-26 NOTE — ED Triage Notes (Signed)
Patient complains of 3-4 days of sob. Reports that her chest feels tight. Cold 2 weeks ago and cough that she describes as comes and goes. Alert and oriented, NAD

## 2022-05-26 NOTE — ED Notes (Signed)
Pt left without dc paperwork. RN tried to call pt, no answer.

## 2022-05-26 NOTE — ED Notes (Signed)
O2 was 99% on room air while ambulating. No complaints of dizziness or light-headedness.

## 2022-12-21 ENCOUNTER — Other Ambulatory Visit: Payer: Self-pay

## 2022-12-21 ENCOUNTER — Emergency Department (HOSPITAL_COMMUNITY)
Admission: EM | Admit: 2022-12-21 | Discharge: 2022-12-21 | Disposition: A | Discharge status: Home / Self Care | Payer: Medicaid Other | Attending: Emergency Medicine | Admitting: Emergency Medicine

## 2022-12-21 DIAGNOSIS — R202 Paresthesia of skin: Secondary | ICD-10-CM | POA: Diagnosis not present

## 2022-12-21 DIAGNOSIS — R200 Anesthesia of skin: Secondary | ICD-10-CM | POA: Diagnosis present

## 2022-12-21 NOTE — ED Notes (Signed)
AVS reviewed with pt prior to discharge. Pt verbalizes understanding of teaching. Belongings with pt upon depart. Ambulatory to POV with family. VSS.

## 2022-12-21 NOTE — ED Provider Notes (Signed)
[place] EMERGENCY DEPARTMENT AT [facility] Provider Note  CSN: [id number] Arrival date & time: 12/21/22 1608  Chief Complaint(s) Numbness  HPI Sara Estes is a 32 y.o. female without significant past medical history presenting to the emergency department with tingling.  She reports tingling bilaterally in her face, hands.  Also reports occasional symptoms in the feet.  Reports this is intermittent.  Reports occasional headaches which seem associated as well as sensitivity to light.  No nausea or vomiting.  No fevers or chills.  Reports occasionally having anxiety.  No fainting.  No weakness.  No numbness.  Reports eating a normal diet.  No abdominal pain, urinary symptoms.   Past Medical History Past Medical History:  Diagnosis Date   Anemia    Chlamydia    Patient Active Problem List   Diagnosis Date Noted   Normal labor 01/14/2022   Vitamin D deficiency 10/24/2021   Suspected fetal abnormality affecting management of mother 10/22/2021   Supervision of high-risk pregnancy with insufficient prenatal care 09/09/2021   Nasal polyposis 03/27/2020   Home Medication(s) Prior to Admission medications   Medication Sig Start Date End Date Taking? Authorizing Provider  albuterol (VENTOLIN HFA) 108 (90 Base) MCG/ACT inhaler Inhale 1-2 puffs into the lungs every 6 (six) hours as needed for wheezing or shortness of breath. 05/26/22   Mannie Stabile, PA-C  ibuprofen (ADVIL) 600 MG tablet Take 1 tablet (600 mg total) by mouth every 6 (six) hours. Patient not taking: Reported on 05/26/2022 01/15/22   Essie Hart, MD  oxyCODONE-acetaminophen (PERCOCET/ROXICET) 5-325 MG tablet Take 1-2 tablets by mouth every 4 (four) hours as needed for moderate pain. Patient not taking: Reported on 05/26/2022 01/15/22   Essie Hart, MD  pantoprazole (PROTONIX) 20 MG tablet Take 1 tablet (20 mg total) by mouth daily for 14 days. 05/26/22 06/09/22  Mannie Stabile, PA-C  Prenatal Vit-Fe  Phos-FA-Omega (VITAFOL GUMMIES) 3.33-0.333-34.8 MG CHEW Chew 3 tablets by mouth daily. Patient not taking: Reported on 05/26/2022 12/12/21   [provider]  triamcinolone (NASACORT ALLERGY 24HR) 55 MCG/ACT AERO nasal inhaler Place 1 spray into the nose daily as needed (allergies).    [provider]                                                                                                                                    Past Surgical History Past Surgical History:  Procedure Laterality Date   NO PAST SURGERIES     Family History No family history on file.  Social History Social History   Tobacco Use   Smoking status: Never   Smokeless tobacco: Never  Vaping Use   Vaping Use: Never used  Substance Use Topics   Alcohol use: No   Drug use: No   Allergies Claritin [loratadine]  Review of Systems Review of Systems  All other systems reviewed and are negative.   Physical Exam Vital  Signs  I have reviewed the triage vital signs BP 103/74 (BP Location: Left Arm)   Pulse 72   Temp 98 F (36.7 C) (Oral)   Resp 18   Ht 5\' 8"  (1.727 m)   Wt 72.6 kg   SpO2 98%   Breastfeeding Yes   BMI 24.33 kg/m  Physical Exam Vitals and nursing note reviewed.  Constitutional:      General: She is not in acute distress.    Appearance: She is well-developed.  HENT:     Head: Normocephalic and atraumatic.     Mouth/Throat:     Mouth: Mucous membranes are moist.  Eyes:     Pupils: Pupils are equal, round, and reactive to light.  Cardiovascular:     Rate and Rhythm: Normal rate and regular rhythm.     Heart sounds: No murmur heard. Pulmonary:     Effort: Pulmonary effort is normal. No respiratory distress.     Breath sounds: Normal breath sounds.  Abdominal:     General: Abdomen is flat.     Palpations: Abdomen is soft.     Tenderness: There is no abdominal tenderness.  Musculoskeletal:        General: No tenderness.     Right lower leg: No edema.      Left lower leg: No edema.  Skin:    General: Skin is warm and dry.  Neurological:     General: No focal deficit present.     Mental Status: She is alert. Mental status is at baseline.     Comments: Cranial nerves II through XII intact, strength 5 out of 5 in the bilateral upper and lower extremities, no sensory deficit to light touch, no dysmetria on finger-nose-finger testing, ambulatory with steady gait.  Psychiatric:        Mood and Affect: Mood normal.        Behavior: Behavior normal.     ED Results and Treatments Labs (all labs ordered are listed, but only abnormal results are displayed) Labs Reviewed - No data to display                                                                                                                        Radiology No results found.  Pertinent labs & imaging results that were available during my care of the patient were reviewed by me and considered in my medical decision making (see MDM for details).  Medications Ordered in ED Medications - No data to display  Procedures Procedures  (including critical care time)  Medical Decision Making / ED Course   MDM:  32 year old female presenting to the emergency department with tingling.  Her neurologic exam is overall normal with no objective sensory deficits.  Her exam also has no weakness.  Suspect symptoms likely due to complex migraine.  Less likely but possible anxiety as she does report anxiety.  Doubt acute intracranial process such as tumor or stroke.  She denies any head trauma.  Discussed migraine cocktail and fortunately patient is with her children and does not have anyone else to watch them so I do not think it is safe to give her a possibly sedating medication which would only be for symptom control.  Doubt symptom like B12 deficiency as patient reports  eating normal diet, not vegan.  Also discussed prescription but patient breast-feeding which limits options.  Advised her to follow-up with neurology and primary physician.  Discussed return precautions for any focal symptoms or weakness suggestive of intracranial process. Will discharge patient to home. All questions answered. Patient comfortable with plan of discharge. Return precautions discussed with patient and specified on the after visit summary.       Additional history obtained: -External records from outside source obtained and reviewed including: Chart review including previous notes, labs, imaging, consultation notes including prior visit for anxiety    Medicines ordered and prescription drug management: No orders of the defined types were placed in this encounter.   -I have reviewed the patients home medicines and have made adjustments as needed  Social Determinants of Health:  Diagnosis or treatment significantly limited by social determinants of health: breastfeeding   Reevaluation: After the interventions noted above, I reevaluated the patient and found that their symptoms have improved  Co morbidities that complicate the patient evaluation  Past Medical History:  Diagnosis Date   Anemia    Chlamydia       Dispostion: Disposition decision including need for hospitalization was considered, and patient discharged from emergency department.    Final Clinical Impression(s) / ED Diagnoses Final diagnoses:  Paresthesias     This chart was dictated using voice recognition software.  Despite best efforts to proofread,  errors can occur which can change the documentation meaning.    Lonell Grandchild, MD 12/21/22 [place] Fruit

## 2022-12-21 NOTE — ED Triage Notes (Signed)
Pt reports numbness and tingling intermittently for one month in bilateral hands, face,and scalp. Feet started having the same sensation two days ago. Pt reports sometimes having weakness and dizziness associated with it, if patient lays down she reports some improvement. Reports tension headache with tightness behind the eyes when the symptoms onset.

## 2022-12-21 NOTE — Discharge Instructions (Signed)
We evaluated you for your numbness.  Your neurologic exam is normal.  We do not think you have any dangerous intracranial process such as bleeding in your brain or brain tumor.  We do not think you have had a stroke.  Please follow-up closely with your primary doctor.  Please also follow-up with neurology.  Your symptoms are most likely caused by an atypical migraine headache or anxiety.  We discussed a prescription but since you are still breast-feeding I think it is safest to wait until you see your primary doctor or neurologist.  Please return to the emergency department if you develop any facial droop, weakness, dizziness, trouble walking, vision changes, or any other concerning symptoms.

## 2023-05-27 ENCOUNTER — Other Ambulatory Visit: Payer: Self-pay

## 2023-05-27 ENCOUNTER — Emergency Department (HOSPITAL_BASED_OUTPATIENT_CLINIC_OR_DEPARTMENT_OTHER)
Admission: EM | Admit: 2023-05-27 | Discharge: 2023-05-27 | Disposition: A | Discharge status: Home / Self Care | Payer: Medicaid Other | Attending: Emergency Medicine | Admitting: Emergency Medicine

## 2023-05-27 ENCOUNTER — Encounter (HOSPITAL_BASED_OUTPATIENT_CLINIC_OR_DEPARTMENT_OTHER): Payer: Self-pay | Admitting: Emergency Medicine

## 2023-05-27 DIAGNOSIS — M7918 Myalgia, other site: Secondary | ICD-10-CM | POA: Insufficient documentation

## 2023-05-27 DIAGNOSIS — L739 Follicular disorder, unspecified: Secondary | ICD-10-CM | POA: Insufficient documentation

## 2023-05-27 DIAGNOSIS — R8289 Other abnormal findings on cytological and histological examination of urine: Secondary | ICD-10-CM | POA: Insufficient documentation

## 2023-05-27 DIAGNOSIS — R102 Pelvic and perineal pain: Secondary | ICD-10-CM | POA: Diagnosis present

## 2023-05-27 DIAGNOSIS — M791 Myalgia, unspecified site: Secondary | ICD-10-CM

## 2023-05-27 LAB — URINALYSIS, ROUTINE W REFLEX MICROSCOPIC
Bacteria, UA: NONE SEEN
Bilirubin Urine: NEGATIVE
Glucose, UA: NEGATIVE mg/dL
Hgb urine dipstick: NEGATIVE
Ketones, ur: 15 mg/dL — AB
Nitrite: NEGATIVE
Specific Gravity, Urine: 1.028 (ref 1.005–1.030)
pH: 6.5 (ref 5.0–8.0)

## 2023-05-27 LAB — PREGNANCY, URINE: Preg Test, Ur: NEGATIVE

## 2023-05-27 NOTE — ED Provider Notes (Signed)
[place] EMERGENCY DEPARTMENT AT [facility] Provider Note   CSN: [id number] Arrival date & time: 05/27/23  1042     History  Chief Complaint  Patient presents with   Generalized Body Aches    Sara Estes is a 32 y.o. female.  HPI 32 year old female previously healthy presents today complaining of some vaginal burning.  She states she has had some symptoms of burning over the past week.  She was seen for a telehealth visit yesterday and was diagnosed with probable yeast and started on medicine for this.  States she took first dose yesterday-presumably fluconazole as patient states it started with half.     Home Medications Prior to Admission medications   Medication Sig Start Date End Date Taking? Authorizing Provider  nystatin cream (MYCOSTATIN) Apply 1 Application topically 2 (two) times daily. 05/24/23 05/23/24 Yes [provider]  albuterol (VENTOLIN HFA) 108 (90 Base) MCG/ACT inhaler Inhale 1-2 puffs into the lungs every 6 (six) hours as needed for wheezing or shortness of breath. 05/26/22   Mannie Stabile, PA-C  ibuprofen (ADVIL) 600 MG tablet Take 1 tablet (600 mg total) by mouth every 6 (six) hours. Patient not taking: Reported on 05/26/2022 01/15/22   Essie Hart, MD  oxyCODONE-acetaminophen (PERCOCET/ROXICET) 5-325 MG tablet Take 1-2 tablets by mouth every 4 (four) hours as needed for moderate pain. Patient not taking: Reported on 05/26/2022 01/15/22   Essie Hart, MD  pantoprazole (PROTONIX) 20 MG tablet Take 1 tablet (20 mg total) by mouth daily for 14 days. 05/26/22 06/09/22  Mannie Stabile, PA-C  triamcinolone (NASACORT ALLERGY 24HR) 55 MCG/ACT AERO nasal inhaler Place 1 spray into the nose daily as needed (allergies).    [provider]      Allergies    Claritin [loratadine]    Review of Systems   Review of Systems  Physical Exam Updated Vital Signs BP 109/74 (BP Location: Right Arm)   Pulse 72   Temp 98.1 F (36.7 C)  (Oral)   Resp 18   Ht 1.702 m (5\' 7" )   Wt 72.6 kg   LMP 04/26/2023 (Exact Date)   SpO2 98%   Breastfeeding Yes   BMI 25.06 kg/m  Physical Exam Vitals and nursing note reviewed.  HENT:     Head: Normocephalic.     Right Ear: External ear normal.     Left Ear: External ear normal.     Nose: Nose normal.     Mouth/Throat:     Pharynx: Oropharynx is clear.  Eyes:     Extraocular Movements: Extraocular movements intact.     Pupils: Pupils are equal, round, and reactive to light.  Cardiovascular:     Rate and Rhythm: Normal rate and regular rhythm.     Pulses: Normal pulses.  Pulmonary:     Effort: Pulmonary effort is normal.  Abdominal:     Palpations: Abdomen is soft.  Musculoskeletal:        General: Normal range of motion.     Cervical back: Normal range of motion.  Skin:    General: Skin is warm and dry.     Comments: Mild erythematous area at the base of hair follicle in the suprapubic region no fluctuance  Neurological:     General: No focal deficit present.     Mental Status: She is alert.  Psychiatric:        Mood and Affect: Mood normal.        Behavior: Behavior normal.  ED Results / Procedures / Treatments   Labs (all labs ordered are listed, but only abnormal results are displayed) Labs Reviewed  URINALYSIS, ROUTINE W REFLEX MICROSCOPIC - Abnormal; Notable for the following components:      Result Value   Ketones, ur 15 (*)    Protein, ur TRACE (*)    Leukocytes,Ua TRACE (*)    All other components within normal limits  PREGNANCY, URINE    EKG None  Radiology No results found.  Procedures Procedures    Medications Ordered in ED Medications - No data to display  ED Course/ Medical Decision Making/ A&P Clinical Course as of 05/27/23 1146  Wed May 27, 2023  1142 Urine is reviewed and interpreted urinalysis 0-5 red blood cells 6-10 white blood cells no bacteria squamous epithelial cells noted consistent with contaminated specimen not  indicative of urinary tract infection Pregnancy test is reviewed and is negative [DR]    Clinical Course User Index [DR] Margarita Grizzle, MD                                 Medical Decision Making Amount and/or Complexity of Data Reviewed Labs: ordered.   32 year old female presents today with some vaginal burning, low back ache and some generalized myalgias.  She has recently been diagnosed with vaginal yeast infection.  She is on day 1 after first fluconazole dose.  Vital signs are stable.  Patient's exam is normal with exception of mild folliculitis Urine was obtained and shows no evidence of acute UTI pregnancy test is negative Patient appears stable for discharge.        Final Clinical Impression(s) / ED Diagnoses Final diagnoses:  Myalgia    Rx / DC Orders ED Discharge Orders     None         Margarita Grizzle, MD 05/27/23 1146

## 2023-05-27 NOTE — Discharge Instructions (Signed)
Please continue medication as prescribed.  You should be rechecked if you are having new or worsening symptoms Your urine does not show any evidence of acute infection.  Your pregnancy test is negative. Please return if you are worse anytime Please follow-up with your primary care doctor

## 2023-05-27 NOTE — ED Triage Notes (Signed)
Pt via pov from home with generalized body aches x 1 week; worsening yesterday. She states they began in her pelvic region and now going to her legs and feet and "I feel stiff." Pt did telehealth visit yesterday and was diagnosed with vaginal yeast infection; started meds yesterday. Pt alert & oriented, nad noted.

## 2023-07-05 IMAGING — DX DG CHEST 2V
2 series · 2 of 2 positions shown · non-contrast
Comparison: None Available.

CLINICAL DATA: Shortness of breath.

EXAM:
CHEST - 2 VIEW

[chest pa]
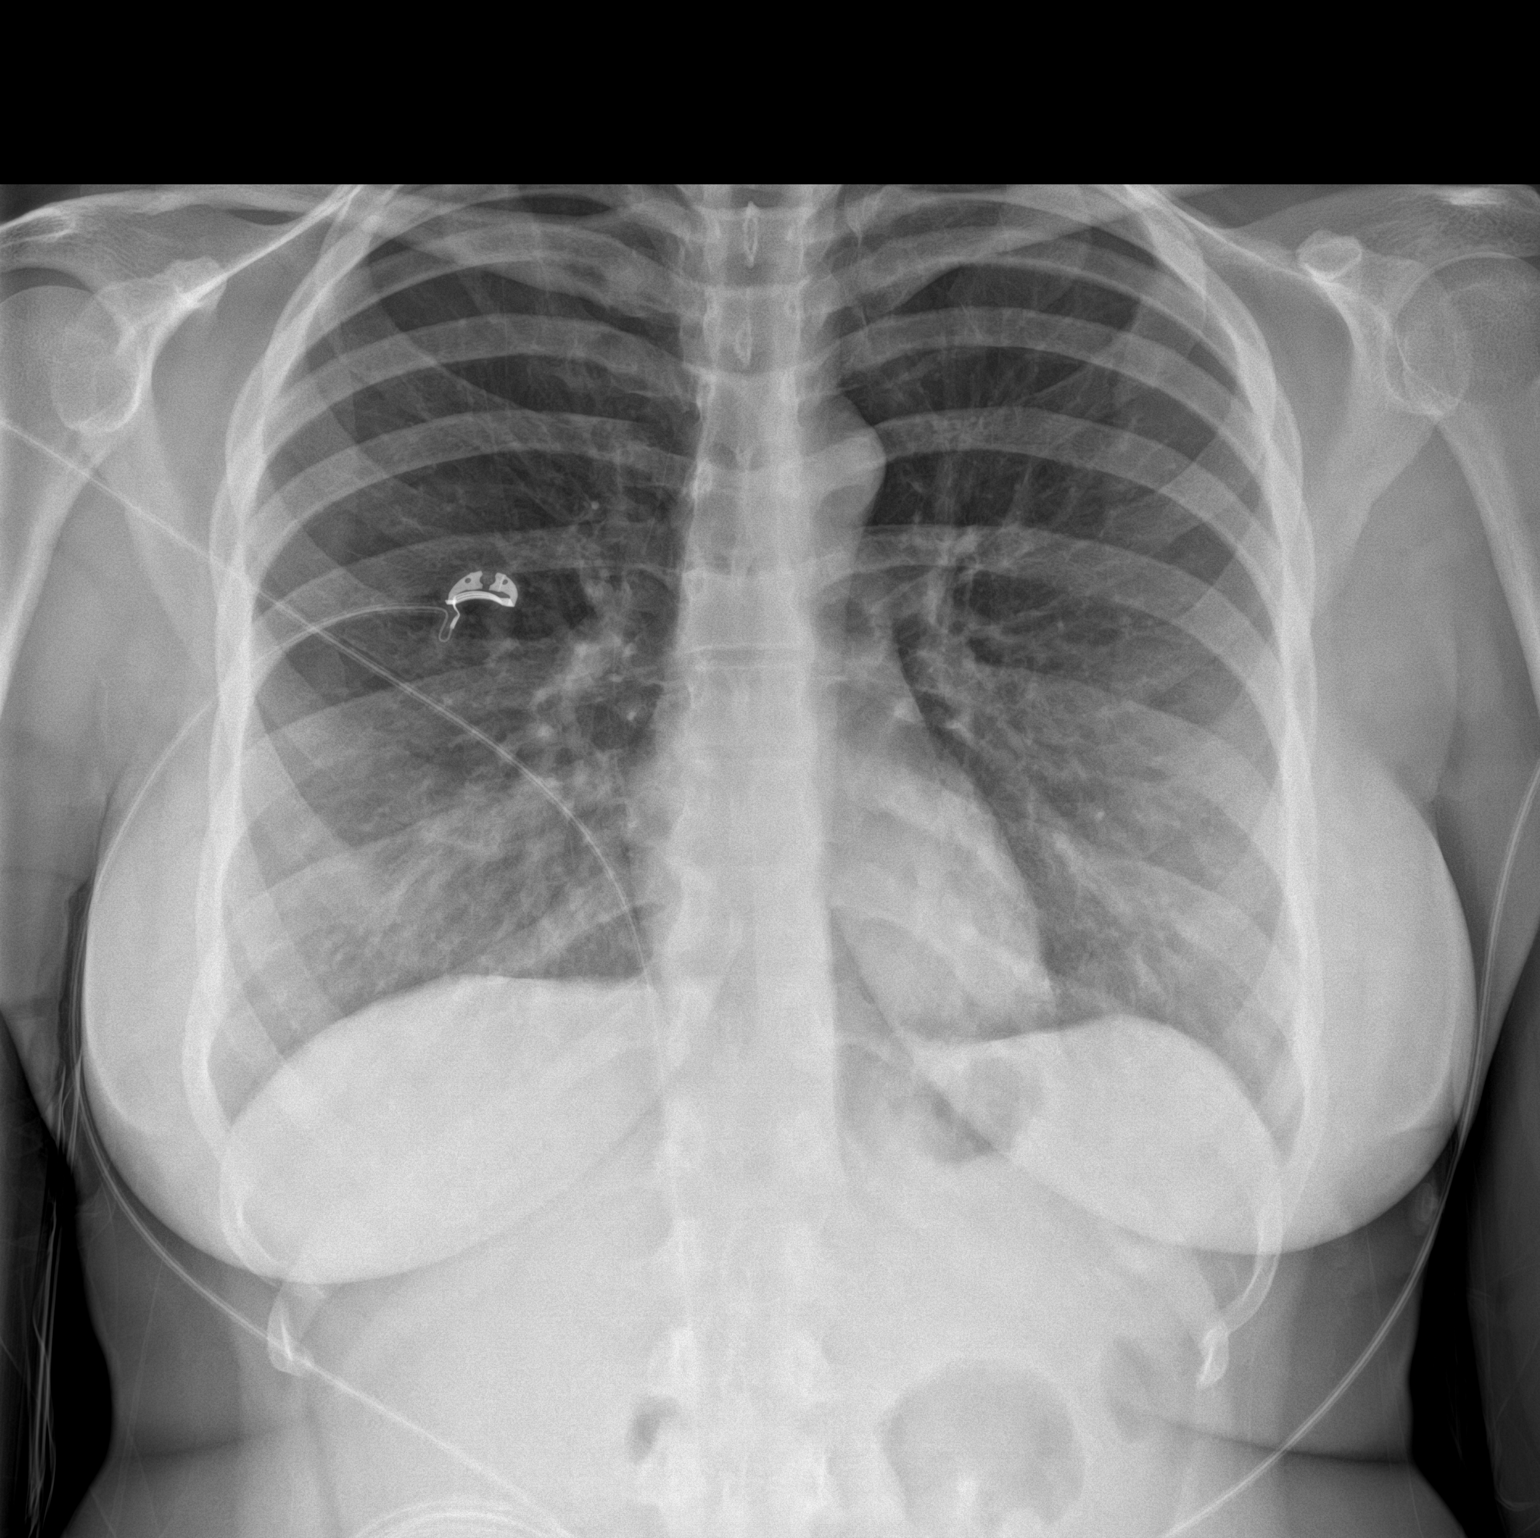

[chest lat]
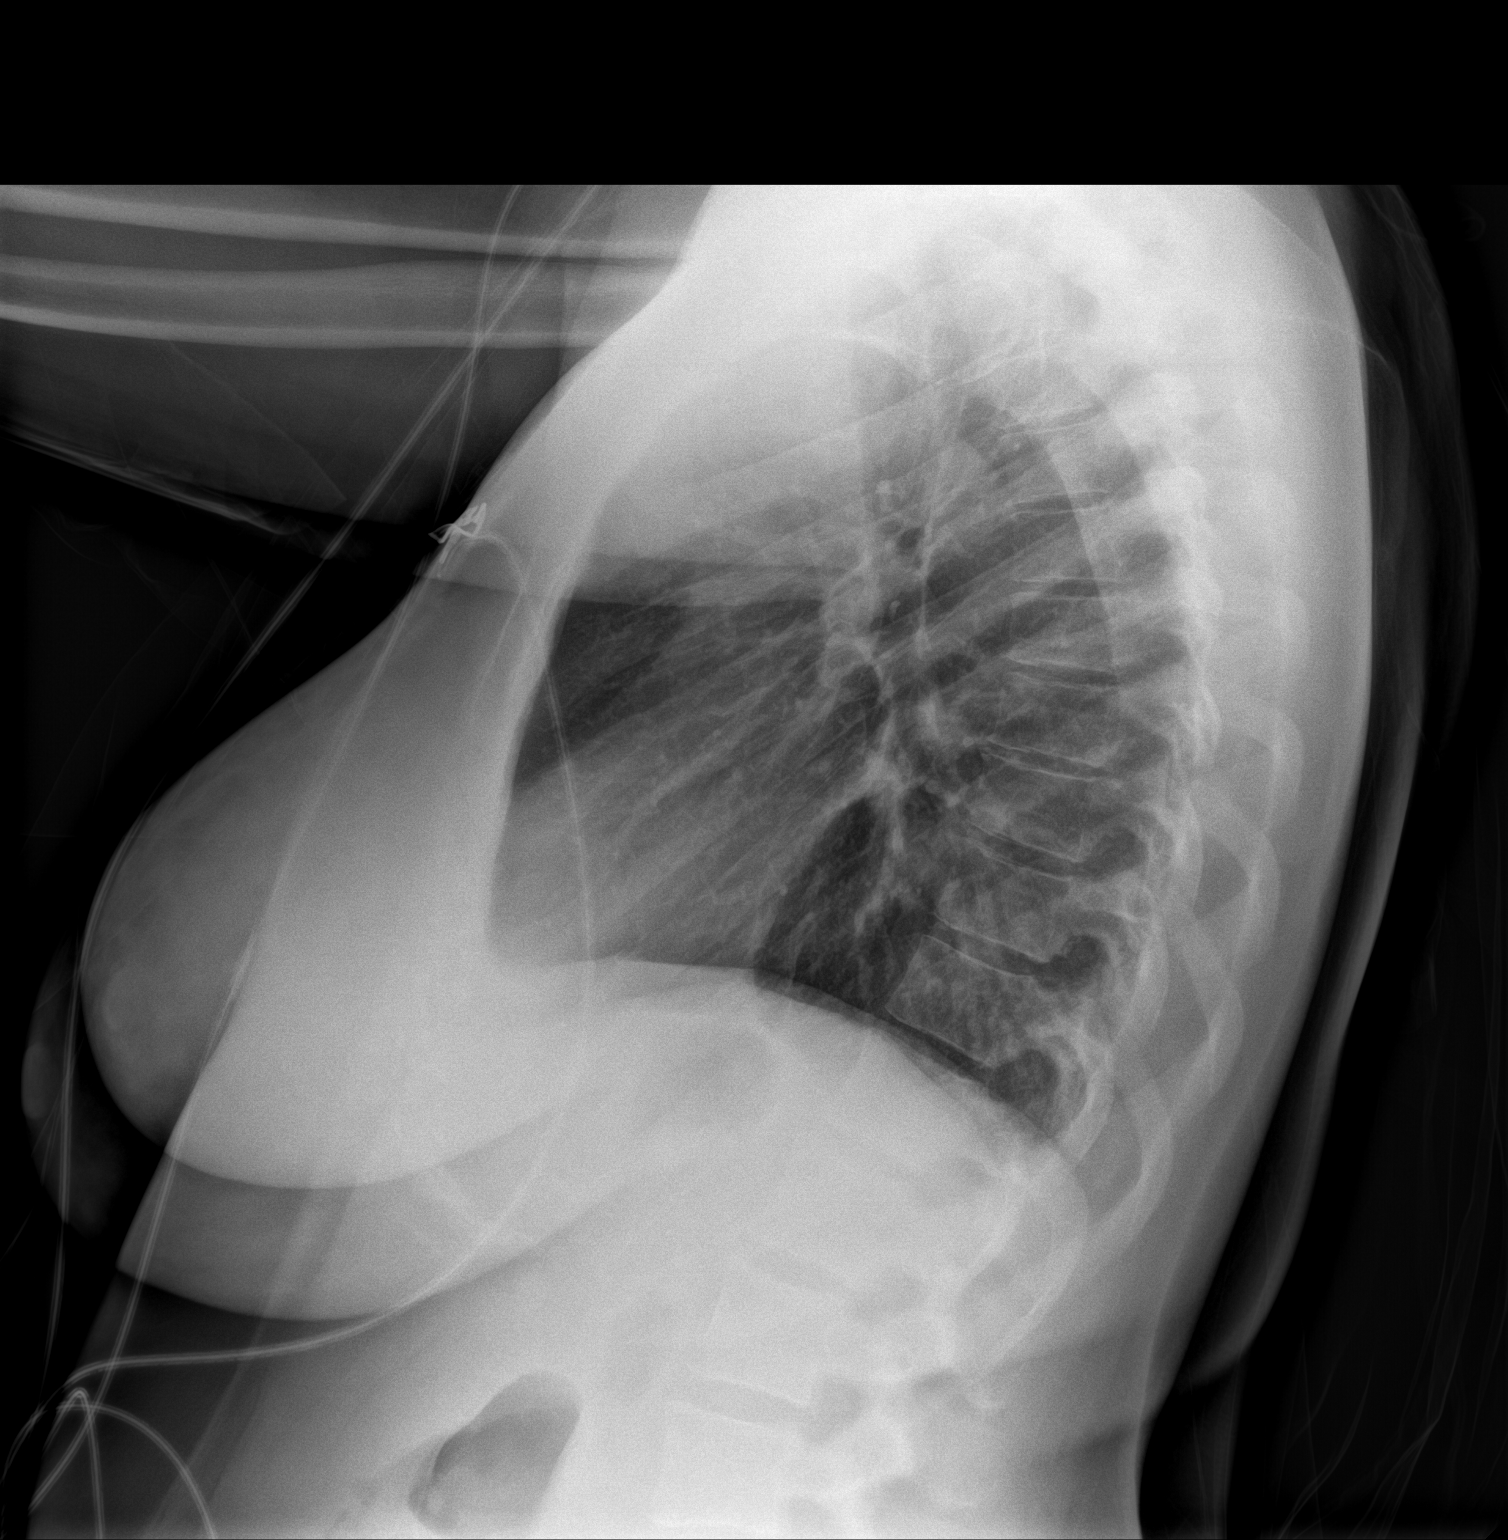

[2 of 2 positions shown; findings below may reference images not displayed]

FINDINGS: The heart size and mediastinal contours are within normal limits.
Both lungs are clear. The visualized skeletal structures are
unremarkable.
IMPRESSION: No active cardiopulmonary disease.

## 2023-07-05 IMAGING — CT CT ANGIO CHEST
2 of 7 series · 18 of 46 positions shown · IV contrast (agent unspecified)
Comparison: None Available.

CLINICAL DATA: Chest pain or SOB, pleurisy or effusion suspected

EXAM:
CT ANGIOGRAPHY CHEST WITH CONTRAST
TECHNIQUE: Multidetector CT imaging of the chest was performed using the
standard protocol during bolus administration of intravenous
contrast. Multiplanar CT image reconstructions and MIPs were
obtained to evaluate the vascular anatomy.

[Series 6: pe axial thins · axial · 0.62mm/px · z∈[+1278,+1551]mm · 15 of 313 slices shown]
[im 20/313  lung]
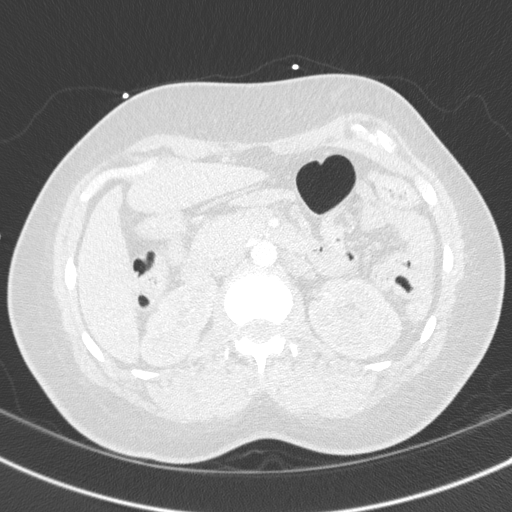
[im 40/313  soft-tissue]
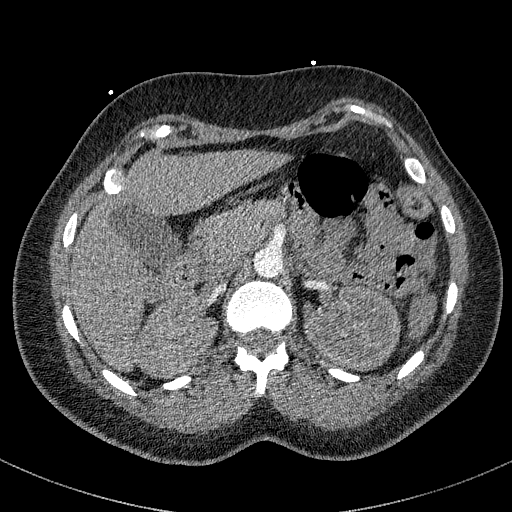
[im 59/313  lung]
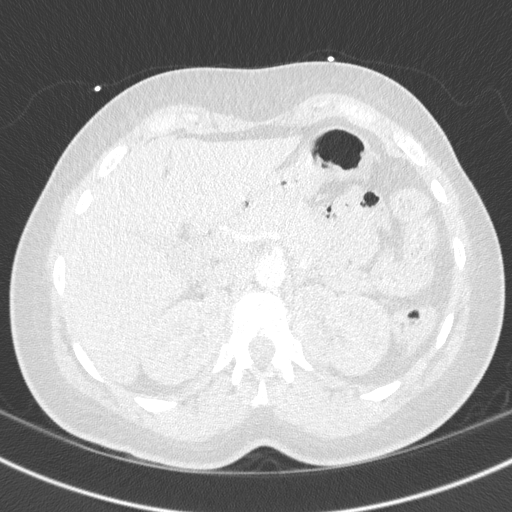
[im 79/313  soft-tissue]
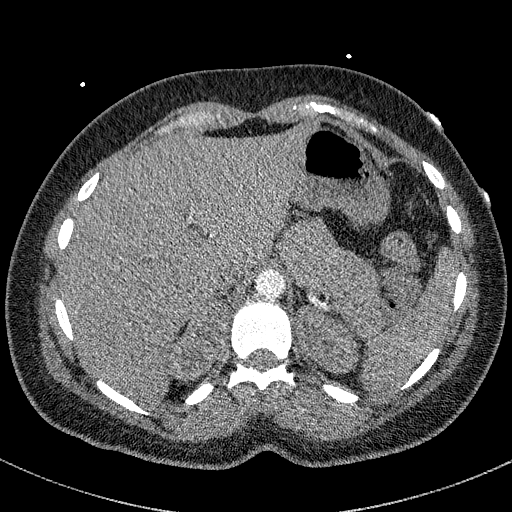
[im 98/313  lung]
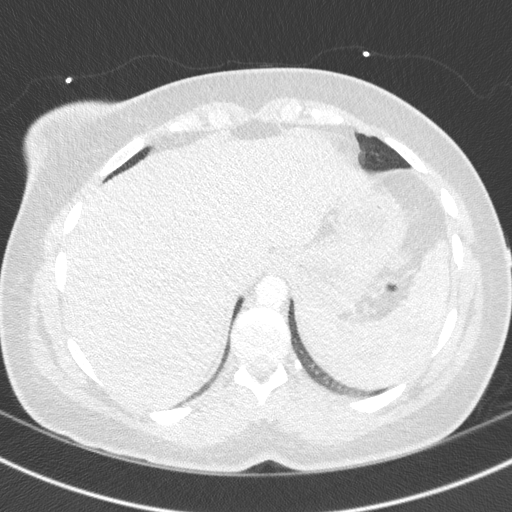
[im 118/313  soft-tissue]
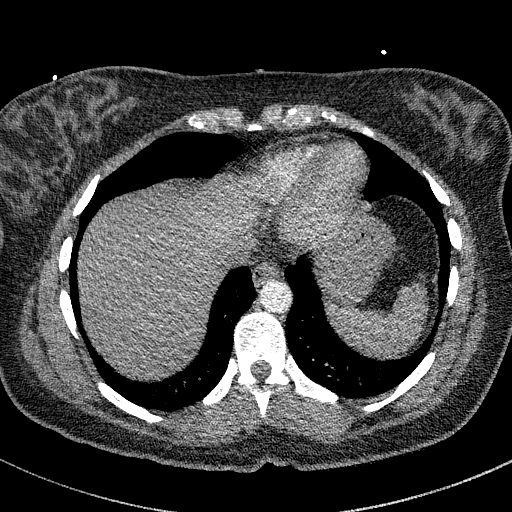
[im 137/313  lung]
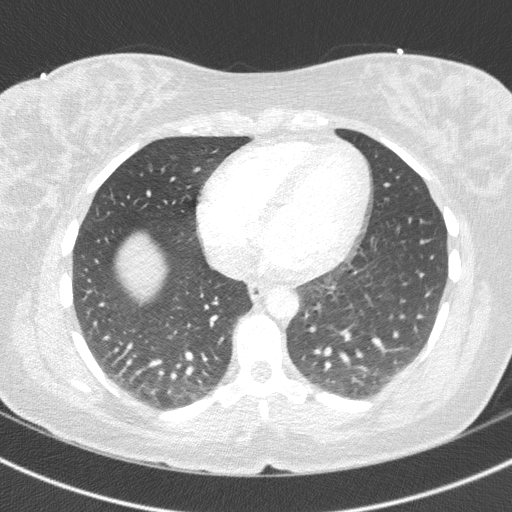
[im 157/313  soft-tissue]
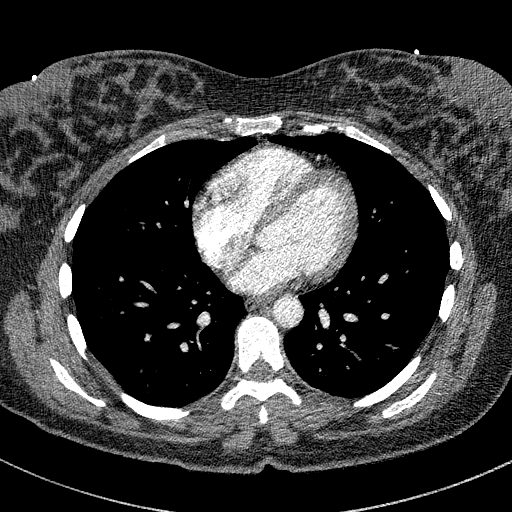
[im 176/313  lung]
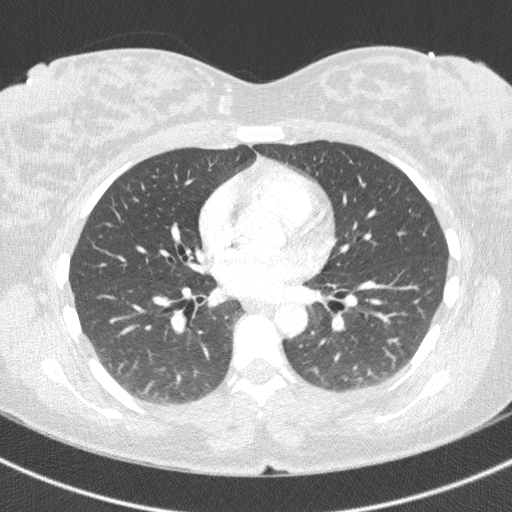
[im 196/313  soft-tissue]
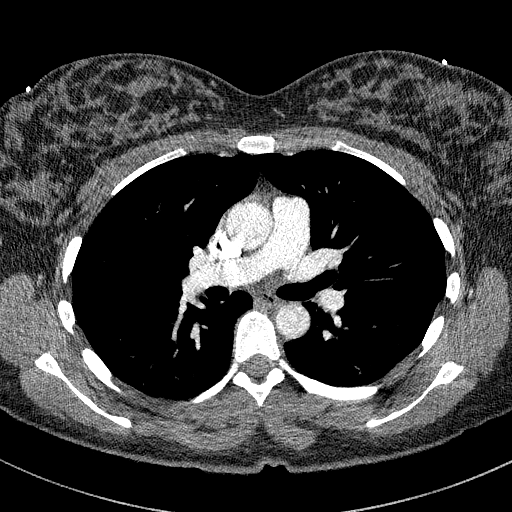
[im 215/313  lung]
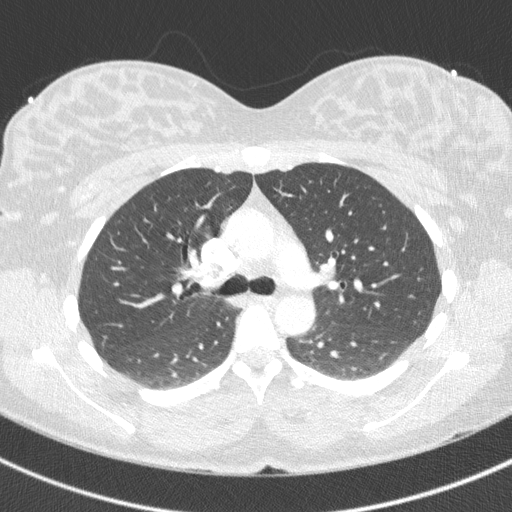
[im 235/313  soft-tissue]
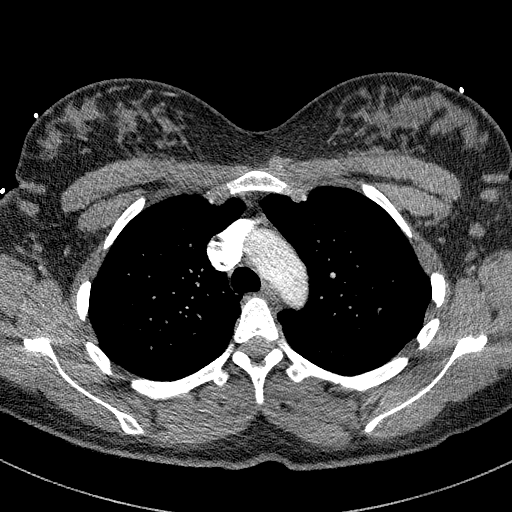
[im 254/313  lung]
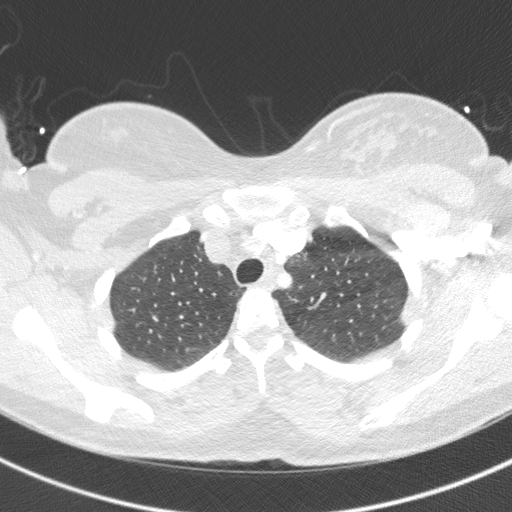
[im 274/313  soft-tissue]
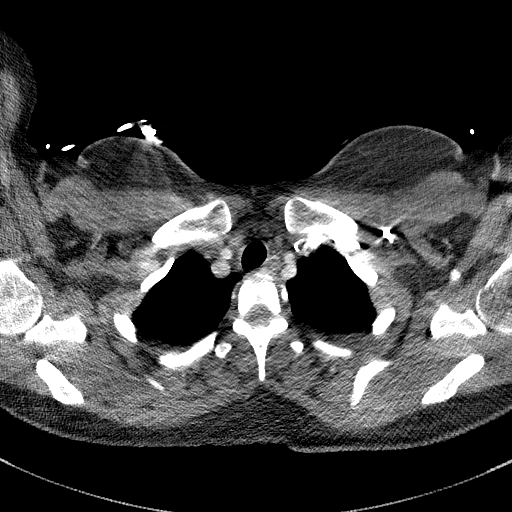
[im 293/313  lung]
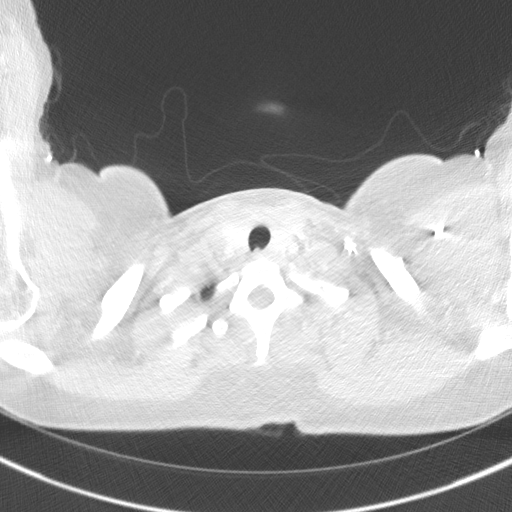

[Series 8: cor soft · coronal · 0.56mm/px · 3 of 125 slices shown]
[im 32/125  soft-tissue]
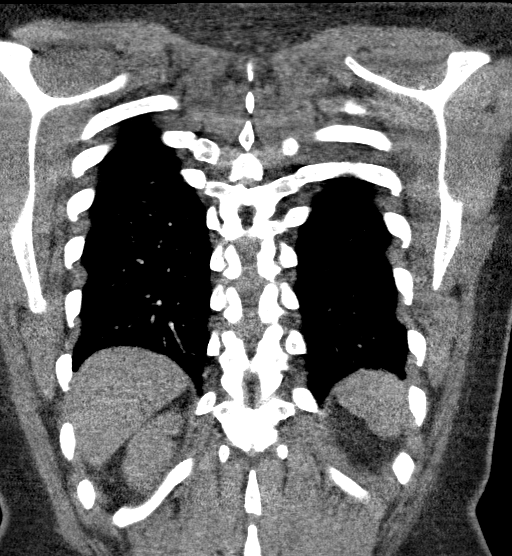
[im 63/125  soft-tissue]
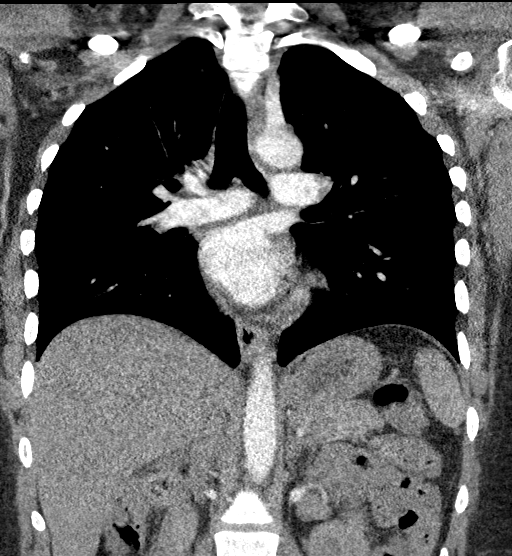
[im 94/125  soft-tissue]
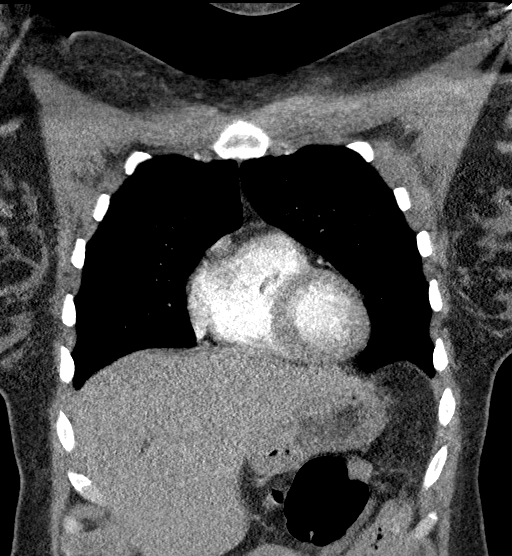

[18 of 46 positions shown; findings below may reference images not displayed]

RADIATION DOSE REDUCTION: This exam was performed according to the
departmental dose-optimization program which includes automated
exposure control, adjustment of the mA and/or kV according to
patient size and/or use of iterative reconstruction technique.

CONTRAST:  52mL OMNIPAQUE IOHEXOL 350 MG/ML SOLN
FINDINGS: Cardiovascular: No filling defects in the pulmonary arteries to
suggest pulmonary emboli. Heart is normal size. Aorta is normal
caliber.

Mediastinum/Nodes: No mediastinal, hilar, or axillary adenopathy.
Trachea and esophagus are unremarkable. Thyroid unremarkable.

Lungs/Pleura: Lungs are clear. No focal airspace opacities or
suspicious nodules. No effusions.

Upper Abdomen: No acute findings

Musculoskeletal: Chest wall soft tissues are unremarkable. No acute
bony abnormality.

Review of the MIP images confirms the above findings.
IMPRESSION: No evidence of pulmonary embolus.

No acute cardiopulmonary disease.

## 2024-01-09 ENCOUNTER — Emergency Department (HOSPITAL_COMMUNITY)
Admission: EM | Admit: 2024-01-09 | Discharge: 2024-01-09 | Disposition: A | Discharge status: Home / Self Care | Attending: Emergency Medicine | Admitting: Emergency Medicine

## 2024-01-09 ENCOUNTER — Other Ambulatory Visit: Payer: Self-pay

## 2024-01-09 ENCOUNTER — Encounter (HOSPITAL_COMMUNITY): Payer: Self-pay | Admitting: *Deleted

## 2024-01-09 DIAGNOSIS — W57XXXA Bitten or stung by nonvenomous insect and other nonvenomous arthropods, initial encounter: Secondary | ICD-10-CM | POA: Insufficient documentation

## 2024-01-09 DIAGNOSIS — S80862A Insect bite (nonvenomous), left lower leg, initial encounter: Secondary | ICD-10-CM | POA: Insufficient documentation

## 2024-01-09 DIAGNOSIS — R21 Rash and other nonspecific skin eruption: Secondary | ICD-10-CM | POA: Insufficient documentation

## 2024-01-09 MED ORDER — DOXYCYCLINE HYCLATE 100 MG PO TABS: 100.0000 mg | ORAL_TABLET | Freq: Once | ORAL | Status: DC

## 2024-01-09 MED ORDER — DOXYCYCLINE HYCLATE 100 MG PO CAPS: 100.0000 mg | ORAL_CAPSULE | Freq: Two times a day (BID) | ORAL | 0 refills | Status: AC

## 2024-01-09 MED ORDER — DOXYCYCLINE HYCLATE 100 MG PO TABS
100.0000 mg | ORAL_TABLET | Freq: Once | ORAL | Status: AC
  Administered 2024-01-09: 100 mg via ORAL
  Filled 2024-01-09: qty 1

## 2024-01-09 NOTE — ED Triage Notes (Signed)
 The pt has a dark spot on her lt knee that itches  she thinks its a tick but it does not appear alive  it has not increased in size

## 2024-01-09 NOTE — ED Provider Notes (Signed)
 [place] EMERGENCY DEPARTMENT AT [facility] Provider Note   CSN: [id number] Arrival date & time: 01/09/24  2107     History  No chief complaint on file.   Sara Estes is a 33 y.o. female here presenting with possible tick bite.  Patient noticed a dark spot on the left knee for a week.  She is concerned that it may be a tick.  She noticed rash around that area.  She also had some subjective chills and neck pain but denies any neck stiffness.  She is requesting antibiotics for tick bite  The history is provided by the patient.       Home Medications Prior to Admission medications   Medication Sig Start Date End Date Taking? Authorizing Provider  albuterol  (VENTOLIN  HFA) 108 (90 Base) MCG/ACT inhaler Inhale 1-2 puffs into the lungs every 6 (six) hours as needed for wheezing or shortness of breath. 05/26/22   Aberman, Caroline C, PA-C  ibuprofen  (ADVIL ) 600 MG tablet Take 1 tablet (600 mg total) by mouth every 6 (six) hours. Patient not taking: Reported on 05/26/2022 01/15/22   Pinn, Walda, MD  nystatin cream (MYCOSTATIN) Apply 1 Application topically 2 (two) times daily. 05/24/23 05/23/24  [provider]  oxyCODONE -acetaminophen  (PERCOCET/ROXICET) 5-325 MG tablet Take 1-2 tablets by mouth every 4 (four) hours as needed for moderate pain. Patient not taking: Reported on 05/26/2022 01/15/22   Pinn, Walda, MD  pantoprazole  (PROTONIX ) 20 MG tablet Take 1 tablet (20 mg total) by mouth daily for 14 days. 05/26/22 06/09/22  Aberman, Caroline C, PA-C  triamcinolone (NASACORT ALLERGY 24HR) 55 MCG/ACT AERO nasal inhaler Place 1 spray into the nose daily as needed (allergies).    [provider]      Allergies    Claritin [loratadine]    Review of Systems   Review of Systems  Skin:  Positive for rash.  All other systems reviewed and are negative.   Physical Exam Updated Vital Signs BP 114/75 (BP Location: Right Arm)   Pulse 81   Temp 98.5 F (36.9 C)   Resp  20   Ht 5\' 7"  (1.702 m)   Wt 72.6 kg   LMP 12/28/2023   SpO2 95%   BMI 25.07 kg/m  Physical Exam Vitals and nursing note reviewed.  Constitutional:      Appearance: Normal appearance.  HENT:     Head: Normocephalic.     Nose: Nose normal.     Mouth/Throat:     Mouth: Mucous membranes are moist.  Eyes:     Extraocular Movements: Extraocular movements intact.     Pupils: Pupils are equal, round, and reactive to light.  Cardiovascular:     Rate and Rhythm: Normal rate and regular rhythm.     Pulses: Normal pulses.     Heart sounds: Normal heart sounds.  Pulmonary:     Effort: Pulmonary effort is normal.     Breath sounds: Normal breath sounds.  Abdominal:     General: Abdomen is flat.     Palpations: Abdomen is soft.  Musculoskeletal:        General: Normal range of motion.     Cervical back: Normal range of motion and neck supple.  Skin:    Comments: Patient has a rash around the left knee.  Patient does have a dark spot consistent with a small tick.  I was able to remove the tick.  Neurological:     General: No focal deficit present.  Mental Status: She is alert and oriented to person, place, and time.  Psychiatric:        Mood and Affect: Mood normal.        Behavior: Behavior normal.     ED Results / Procedures / Treatments   Labs (all labs ordered are listed, but only abnormal results are displayed) Labs Reviewed - No data to display  EKG None  Radiology No results found.  Procedures Procedures    Diagnosis: Foreign Body - Location: L knee Procedure: Foreign body removal Type of extraction: simple  Informed consent:  Discussed the risks (permanent scarring, light or dark discoloration, infection, pain, bleeding, bruising, redness, blister formation, and recurrence of the lesion) and the benefits of the procedure, as well as the alternatives.  Informed consent was obtained. Anesthesia: none The area was prepared and draped in a standard fashion. It  was extracted by forceps retrieval.   Medications Ordered in ED Medications  doxycycline (VIBRA-TABS) tablet 100 mg (has no administration in time range)    ED Course/ Medical Decision Making/ A&P                                 Medical Decision Making Sara Estes is a 33 y.o. female here presenting with tick bite to the left knee.  There is some surrounding cellulitis.  I was able to remove the tick.  Will give a course of doxycycline    Problems Addressed: Tick bite of left lower leg, initial encounter: acute illness or injury  Risk Prescription drug management.    Final Clinical Impression(s) / ED Diagnoses Final diagnoses:  None    Rx / DC Orders ED Discharge Orders     None         Dalene Duck, MD 01/09/24 2203

## 2024-01-09 NOTE — Discharge Instructions (Signed)
 As we discussed, I was able to remove a tick from your knee   I have prescribed doxycycline twice a day for a week  See your doctor for follow-up  Return to ER if you have fever or chills or worsening rash

## 2024-01-09 NOTE — ED Triage Notes (Signed)
 The pt is also c/o neck pain and  worse with movement
